# Patient Record
Sex: Female | Born: 1935
Health system: Southern US, Community
[De-identification: ages and names within clinical notes are randomized; demographics above are authoritative.]

## PROBLEM LIST (undated history)

## (undated) DIAGNOSIS — M810 Age-related osteoporosis without current pathological fracture: Secondary | ICD-10-CM

## (undated) DIAGNOSIS — G43909 Migraine, unspecified, not intractable, without status migrainosus: Secondary | ICD-10-CM

## (undated) DIAGNOSIS — E785 Hyperlipidemia, unspecified: Secondary | ICD-10-CM

## (undated) DIAGNOSIS — J449 Chronic obstructive pulmonary disease, unspecified: Secondary | ICD-10-CM

## (undated) DIAGNOSIS — K449 Diaphragmatic hernia without obstruction or gangrene: Secondary | ICD-10-CM

## (undated) DIAGNOSIS — D649 Anemia, unspecified: Secondary | ICD-10-CM

## (undated) DIAGNOSIS — G4700 Insomnia, unspecified: Secondary | ICD-10-CM

## (undated) DIAGNOSIS — F419 Anxiety disorder, unspecified: Secondary | ICD-10-CM

## (undated) DIAGNOSIS — F32A Depression, unspecified: Secondary | ICD-10-CM

## (undated) DIAGNOSIS — K589 Irritable bowel syndrome without diarrhea: Secondary | ICD-10-CM

## (undated) DIAGNOSIS — G20A1 Parkinson's disease without dyskinesia, without mention of fluctuations: Secondary | ICD-10-CM

## (undated) DIAGNOSIS — G20 Parkinson's disease: Secondary | ICD-10-CM

## (undated) DIAGNOSIS — K219 Gastro-esophageal reflux disease without esophagitis: Secondary | ICD-10-CM

## (undated) DIAGNOSIS — R413 Other amnesia: Secondary | ICD-10-CM

## (undated) DIAGNOSIS — E039 Hypothyroidism, unspecified: Secondary | ICD-10-CM

## (undated) DIAGNOSIS — E559 Vitamin D deficiency, unspecified: Secondary | ICD-10-CM

## (undated) DIAGNOSIS — F329 Major depressive disorder, single episode, unspecified: Secondary | ICD-10-CM

## (undated) DIAGNOSIS — D126 Benign neoplasm of colon, unspecified: Secondary | ICD-10-CM

## (undated) HISTORY — DX: Irritable bowel syndrome, unspecified: K58.9

## (undated) HISTORY — PX: ABDOMINAL HYSTERECTOMY: SHX81

## (undated) HISTORY — DX: Benign neoplasm of colon, unspecified: D12.6

## (undated) HISTORY — DX: Major depressive disorder, single episode, unspecified: F32.9

## (undated) HISTORY — DX: Depression, unspecified: F32.A

## (undated) HISTORY — DX: Anemia, unspecified: D64.9

## (undated) HISTORY — DX: Hypothyroidism, unspecified: E03.9

## (undated) HISTORY — DX: Chronic obstructive pulmonary disease, unspecified: J44.9

## (undated) HISTORY — DX: Vitamin D deficiency, unspecified: E55.9

## (undated) HISTORY — DX: Gastro-esophageal reflux disease without esophagitis: K21.9

## (undated) HISTORY — DX: Parkinson's disease without dyskinesia, without mention of fluctuations: G20.A1

## (undated) HISTORY — DX: Other amnesia: R41.3

## (undated) HISTORY — DX: Age-related osteoporosis without current pathological fracture: M81.0

## (undated) HISTORY — DX: Migraine, unspecified, not intractable, without status migrainosus: G43.909

## (undated) HISTORY — DX: Parkinson's disease: G20

## (undated) HISTORY — DX: Diaphragmatic hernia without obstruction or gangrene: K44.9

## (undated) HISTORY — DX: Anxiety disorder, unspecified: F41.9

## (undated) HISTORY — DX: Hyperlipidemia, unspecified: E78.5

## (undated) HISTORY — DX: Insomnia, unspecified: G47.00

---

## 1999-05-28 ENCOUNTER — Other Ambulatory Visit: Admission: RE | Admit: 1999-05-28 | Discharge: 1999-05-28 | Payer: Self-pay | Admitting: Obstetrics and Gynecology

## 1999-05-28 ENCOUNTER — Encounter: Admission: RE | Admit: 1999-05-28 | Discharge: 1999-05-28 | Payer: Self-pay | Admitting: Obstetrics and Gynecology

## 2000-09-17 DIAGNOSIS — D126 Benign neoplasm of colon, unspecified: Secondary | ICD-10-CM

## 2000-09-17 HISTORY — DX: Benign neoplasm of colon, unspecified: D12.6

## 2000-11-18 ENCOUNTER — Encounter: Admission: RE | Admit: 2000-11-18 | Discharge: 2000-11-18 | Payer: Self-pay | Admitting: Obstetrics and Gynecology

## 2000-11-18 ENCOUNTER — Encounter: Payer: Self-pay | Admitting: Obstetrics and Gynecology

## 2000-11-18 ENCOUNTER — Other Ambulatory Visit: Admission: RE | Admit: 2000-11-18 | Discharge: 2000-11-18 | Payer: Self-pay | Admitting: Obstetrics and Gynecology

## 2002-01-17 ENCOUNTER — Ambulatory Visit (HOSPITAL_BASED_OUTPATIENT_CLINIC_OR_DEPARTMENT_OTHER): Admission: RE | Admit: 2002-01-17 | Discharge: 2002-01-18 | Payer: Self-pay | Admitting: Orthopedic Surgery

## 2002-01-17 ENCOUNTER — Encounter: Payer: Self-pay | Admitting: Emergency Medicine

## 2002-04-19 ENCOUNTER — Encounter: Payer: Self-pay | Admitting: Obstetrics and Gynecology

## 2002-04-19 ENCOUNTER — Encounter: Admission: RE | Admit: 2002-04-19 | Discharge: 2002-04-19 | Payer: Self-pay | Admitting: Obstetrics and Gynecology

## 2002-08-17 HISTORY — PX: OTHER SURGICAL HISTORY: SHX169

## 2002-11-20 ENCOUNTER — Ambulatory Visit (HOSPITAL_BASED_OUTPATIENT_CLINIC_OR_DEPARTMENT_OTHER): Admission: RE | Admit: 2002-11-20 | Discharge: 2002-11-20 | Payer: Self-pay | Admitting: Orthopedic Surgery

## 2003-04-25 ENCOUNTER — Encounter: Payer: Self-pay | Admitting: Obstetrics and Gynecology

## 2003-04-25 ENCOUNTER — Encounter: Admission: RE | Admit: 2003-04-25 | Discharge: 2003-04-25 | Payer: Self-pay | Admitting: Obstetrics and Gynecology

## 2005-02-16 ENCOUNTER — Encounter: Admission: RE | Admit: 2005-02-16 | Discharge: 2005-02-16 | Payer: Self-pay | Admitting: Obstetrics and Gynecology

## 2006-06-10 ENCOUNTER — Encounter: Admission: RE | Admit: 2006-06-10 | Discharge: 2006-06-10 | Payer: Self-pay | Admitting: Obstetrics and Gynecology

## 2008-12-13 ENCOUNTER — Encounter: Admission: RE | Admit: 2008-12-13 | Discharge: 2008-12-13 | Payer: Self-pay | Admitting: Internal Medicine

## 2011-01-02 NOTE — H&P (Signed)
[place]  Patient:    Judy, Holt Visit Number: [id number] MRN: [id number]          Service Type: DSU Location: [facility] Attending Physician:  Susa Day Dictated by:   Katy Fitch Naaman Plummer., M.D. Proc. Date: 01/17/02 Admit Date:  01/17/2002 Discharge Date: 01/18/2002                           History and Physical  CHIEF COMPLAINT:  Painful, deformed left wrist.  HISTORY OF PRESENT ILLNESS:  Judy Holt is a 75 year old right-hand dominant woman who earlier this morning caught her foot on the floor covering at work, falling full-weight onto her outstretched left hand.  She also sustained some direct injury to her left chest wall.  She was transported to the [facility] emergency room, where she was evaluated by Theora Gianotti, P.A.-C., and had x-rays of her left wrist and chest.  Her wrist films demonstrated a significantly comminuted, impacted, and dorsally-angulated fracture of the left radius with an ulnar styloid fracture. Her chest x-ray was clear without signs of displaced rib fractures.  An upper extremity orthopedic consult was requested by the family, who is familiar with the [facility].  PAST MEDICAL HISTORY:  Her past medical history was reviewed in detail.  She has a history of Parkinsons syndrome with symptoms primarily presenting on the right side.  She is managed by Genene Churn. Love, M.D., with Sinemet and dopamine medication she could not recall the exact name and dosage of.  Her family physician is Dr. Harrison Mons in [place], [place].  ALLERGIES:  She reports an allergy to CEFTIN.  MEDICATIONS:  Her current medications include Sinemet, Premarin, Synthroid, Claritin, Prozac, and Zocor.  PAST SURGICAL HISTORY:  She has had past sinus surgery in the 1990s and a hysterectomy in 1987.  FAMILY HISTORY:  Her family history was detailed, otherwise noncontributory.  REVIEW OF  SYSTEMS:  No significant cardiac or pulmonary problems.  Her neurologic history was positive for Parkinsons syndrome, managed with medication, with tremor primarily presenting on the right side.  She did not have a significant gait disturbance as a consequence of her Parkinsons syndrome.  She had no hematologic abnormalities.  Endocrine abnormalities were positive for hypothyroidism managed on Synthroid.  Her GI review of systems was negative.  GU negative.  Musculoskeletal negative except for acute fracture.  She does have visual impairment and uses reading glasses and has dentures.  Her reproductive review of systems revealed that she is postmenopausal.  Her skin was unremarkable.  She had no difficulties with sleep and had a normal diet and no sign of malnourishment.  PHYSICAL EXAMINATION:  VITAL SIGNS:  Temperature 97.6, pulse 75 and regular, respirations 16, blood pressure 120/72.  She reports that she is 5 feet and 112 pounds.  GENERAL:  An alert, otherwise well-appearing 75 year old woman.  Her general exam revealed an alert, small 75 year old woman with obvious deformity of her left wrist.  She did not show any signs of facial or head trauma.  She reported on discomfort in her chest, abdomen, or lower extremities.  CHEST:  Clear to auscultation.  CARDIAC:  S1, S2, no murmur or gallop appreciated.  ABDOMEN:  Unremarkable.  NEUROLOGIC:  Grossly intact with intact sensibility in her fingers and lower extremities.  REPRODUCTIVE, BREASTS:  Deferred.  DIAGNOSTIC STUDIES:  X-rays of her chest AP films demonstrated good inspiration, clear lung  fields bilaterally, and normal cardiac shadow, no sign of displaced rib fractures.  AP, lateral, and scaphoid view of her left wrist demonstrated an impacted (approximately 1 cm), dorsally angulated (approximately 45 degrees), comminuted fracture of the distal radius with an apparent intra-articular fracture into the lunate facet with a  significant impaction and displacement.  She was approximately ulnar plus by 3 mm due to the displacement of the fracture.  The ulnar styloid was displaced.  ASSESSMENT:  Status post fall this morning with closed impacted fracture, left distal radius and ulnar styloid avulsion.  Given her degree of displacement, age, and general medical condition, I have recommended proceeding with open reduction and internal fixation with a DVR volar plate system.  The advantages presented to the patient and family included accurate reduction and maintenance of reduction and the ability to begin immediate range of motion exercises and early rehabilitation of the left arm.  In our experience, we have had considerably less difficulties with swelling, joint stiffness, and the development of reflex sympathetic dystrophy with internal fixation of these fractures.  Alternative treatments, including external fixation and cast fixation, were briefly discussed and rejected due to the superior outcomes we have experienced with the DVR system.  Potential risks of infection and plate loosening and/or failure were discussed.  Ms. Vessey reports that she had a bone density study last year and that she was not advised that she had significant osteoporosis or osteopenia.  She does use hormone replacement therapy and takes calcium on a regular basis. Dictated by:   Katy Fitch Naaman Plummer., M.D. Attending Physician:  Susa Day DD:  01/17/02 TD:  01/18/02 Job: [phone] [id number]

## 2011-01-02 NOTE — Op Note (Signed)
[place]  Patient:    Judy Holt, Judy Holt Visit Number: [id number] MRN: [id number]          Service Type: DSU Location: [facility] Attending Physician:  Susa Day Dictated by:   Katy Fitch Naaman Plummer., M.D. Proc. Date: 01/17/02 Admit Date:  01/17/2002 Discharge Date: 01/18/2002                             Operative Report  PREOPERATIVE DIAGNOSIS:  Comminuted impacted fracture of right distal radius sustained in a fall at work this morning.  POSTOPERATIVE DIAGNOSIS:  Comminuted impacted fracture of right distal radius sustained in a fall at work this morning.  OPERATION PERFORMED:  Open reduction internal fixation of left radius with application of a DVR volar fixation plate to obtain and maintain anatomic reduction of radius and allow early active range of motion exercises and early forearm and wrist rehabilitation.  SURGEON:  Katy Fitch. Naaman Plummer., M.D.  ANESTHESIA:  Axillary block.  SUPERVISING ANESTHESIOLOGIST:  Dr. Noreene Larsson.  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  She has right hemiparesis due to Parkinsons syndrome.  While ambulating at work today, she actually caught one of her feet on the floor surface and fell full weightbearing onto her left side.  She had a contusion type injury to her chest wall and an injury to her left wrist.  She was transported to [facility] [facility] [employer] [facility] Emergency Department where an upper extremity orthopedic consult was requested by her family.  One of her daughters is an Human resources officer of the [employer] house manager.  Clinical examination revealed a very pleasant 75 year old woman who was awake and alert and had no sign of significant head chest, abdominal or pelvic injury.  She did have an obvious deformity of the left wrist.  She has been evaluated by the emergency room staff and had had a  chest x-ray demonstrating clear lung fields and no sign of displaced rib fractures.  AP lateral and scaphoid views of the left wrist demonstrated a comminuted, impacted distal metaphyseal fracture of the radius with more than 45 degrees of dorsal angulation of the end plate, marked comminution of the dorsal cortex and loss of about 1 cm of length.  She was clearly ulnar plus.  She also had an avulsion of the ulnar styloid.  She had had a bone density checked last year and was advised she was not osteoporotic.  She has been taking both calcium and estrogen supplements.  I advised her to consider open reduction internal fixation to obtain and maintain anatomic reduction of her radius to facilitate early rehabilitation. After conferring with her husband and daughters, she decided to proceed with surgery as recommended.  Alternative treatments in the form of closed reduction and cast, immobilization and/or external fixation were briefly discussed and in my judgment were not optimum choices.  She understands that there is a small risk of infection and/or plate complications with open reduction internal fixation.  DESCRIPTION OF PROCEDURE:  [name] was brought to the operating room and placed in supine position on the operating table.  Following placement of an axillary block in the holding area by Dr. Noreene Larsson, anesthesia was satisfactory in the left arm.  The arm was prepped with Betadine soap and solution and sterilely draped.  500 mg of vancomycin was administered as an IV prophylactic  antibiotic.  The left arm was exsanguinated with an Esmarch bandage and an arterial tourniquet on the proximal brachium was inflated to 230 mHg.  The procedure commenced with an extended flexor carpi radialis incision as outlined for DVR plate placement.  The subcutaneous tissues were carefully divided  taking care to identify the superficial branch of the radial artery an the flexor carpi radialis.   The flexor carpi radialis was retracted in an ulnar direction and the floor of the flexor carpi radialis tendon sheath incised.  The flexor pollicis longus, longus, median nerve and superficialis muscles were retracted in an ulnar direction with blunt retractors and the radial artery retracted in a radial direction.  The pronator quadratus was isolated and elevated with a Therapist, nutritional.  The fracture site was significantly displaced and impacted.  The hematoma was cleared and the fracture reduced anatomically. The wrist was flexed with a towel behind the carpus.  A small DVR left plate was placed with provisional fixation in the sliding hole.  This was advanced to be properly positioned to support the metaphysis.  The threaded pegs were then placed sequentially utilizing fully threaded pegs times two and partially threaded pegs times two.  The C-arm fluoroscope was used to document anatomic reduction and very satisfactory position of the pegs into the distal epiphysis without violating the joint surface of the radius.  A 2 mm radial plus configuration was achieved.  The dorsal cortex was reduced anatomically, however, the comminution was easily visualized with the fluoroscope.  The dorsal periosteum should reduce the fracture fragments and allow rapid healing of the dorsal cortex.  The plate was fixed with the standard volar threaded ASIF screws.  AP lateral and oblique C-arm images were obtained documenting good  position of all hardware and anatomic reduction of the radius.  The ulnar styloid fracture fragment was displaced but appeared to be in satisfactory position. Full pronation and supination of the forearm was noted with stability of the distal radioulnar joint being achieved.  The wound was then irrigated with sterile saline followed by repair of the pronator quadratus over the plate with mattress sutures of 3-0 Vicryl followed by repair of the skin with subdermal sutures of  4-0 Vicryl and intradermal 3-0 Prolene and Steri-Strips. Tourniquet was released with immediate capillary refill of the thumb and  fingers followed by application of voluminous sterile gauze and sterile Webril dressing and a sugar-tong splint with the forearm in full supination. For aftercare, Ms. Neer will be admitted to [facility] for observation of her vital signs and appropriate analgesics in the form of IV PCA morphine, IV Dilaudid and p.o. Percocet.  She is noted to be ALLERGIC TO CEPHALOSPORINS, therefore she is given IV vancomycin as a prophylactic antibiotic and she will be discharged with Levaquin 500 mg 1 p.o. q.d. times five days as a prophylactic antibiotic.  She is given a prescription for Percocet 5 mg one or two tablets p.o. q.4-6h. p.r.n. pain, 20 tablets without refill.  We anticipate seeing her back in the office in follow-up in seven to 10 days. Dictated by:   Katy Fitch Naaman Plummer., M.D. Attending Physician:  Susa Day DD:  01/17/02 TD:  01/18/02 Job: [phone] [id number]

## 2011-01-02 NOTE — Op Note (Signed)
   Judy Holt, Judy Holt                        ACCOUNT NO.:  [id number]   MEDICAL RECORD NO.:  [id number]                   PATIENT TYPE:  AMB   LOCATION:  DSC                                  FACILITY:  [facility]   PHYSICIAN:  Katy Fitch. Naaman Plummer., M.D.          DATE OF BIRTH:  [birth year]   DATE OF PROCEDURE:  11/21/2002  DATE OF DISCHARGE:                                 OPERATIVE REPORT   PREOPERATIVE DIAGNOSIS:  Chronic entrapment neuropathy, median nerve, left  carpal tunnel.   POSTOPERATIVE DIAGNOSIS:  Chronic entrapment neuropathy, median nerve, left  carpal tunnel.   OPERATION:  Release of left transverse carpal ligament.   SURGEON:  Katy Fitch. Sypher, M.D.   ASSISTANT:  Marveen Reeks. Dasnoit, P.A.-C.   ANESTHESIA:  General by LMA.   ANESTHESIOLOGIST:  Quita Skye. Krista Blue, M.D.   INDICATIONS FOR PROCEDURE:  The patient is a 75 year old woman who has  experienced bilateral hand numbness.  Clinical examination suggests a carpal  tunnel syndrome, and the electrodiagnostic studies confirm carpal tunnel  syndrome.  Due to a failure to respond to non-operative measures, she is brought to the  operating room at this time for a release of her left transverse carpal  ligament.   DESCRIPTION OF PROCEDURE:  The patient is brought to the operating room and  placed in the supine position upon the operating room table.  Following  induction of general anesthesia by LMA, the left arm was prepped with  Betadine soap and solution and sterilely draped.  Following exsanguination  of the left arm with an Esmarch bandage and arterial tourniquet on the  proximal brachium, was inflated to 220 mmHg.  The procedure commenced with a  short incision in the line of the ring finger and the palm.  The  subcutaneous tissues were carefully divided, revealing the palmar fascia.  This was split longitudinally to reveal the common sensory branches of the  median nerve.  These were then followed back to the  transverse carpal  ligament which was carefully isolated from the median nerve.  Bleeding  points along the margin of the released ligament were electrocauterized with  bipolar current, followed by a repair of the skin with intradermal #3-0  Prolene suture.  A compressive dressing is applied with a volar plaster  splint maintaining the wrist in 5 degrees of dorsiflexion.  There were no  apparent complications.                                                 Katy Fitch Naaman Plummer., M.D.    RVS/MEDQ  D:  11/21/2002  T:  11/22/2002  Job:  [id number]

## 2012-10-08 ENCOUNTER — Encounter: Payer: Self-pay | Admitting: Neurology

## 2012-10-08 DIAGNOSIS — G20A1 Parkinson's disease without dyskinesia, without mention of fluctuations: Secondary | ICD-10-CM | POA: Insufficient documentation

## 2012-10-08 DIAGNOSIS — M810 Age-related osteoporosis without current pathological fracture: Secondary | ICD-10-CM | POA: Insufficient documentation

## 2012-10-08 DIAGNOSIS — G20 Parkinson's disease: Secondary | ICD-10-CM

## 2012-10-08 DIAGNOSIS — E039 Hypothyroidism, unspecified: Secondary | ICD-10-CM | POA: Insufficient documentation

## 2012-10-08 DIAGNOSIS — G43909 Migraine, unspecified, not intractable, without status migrainosus: Secondary | ICD-10-CM | POA: Insufficient documentation

## 2012-10-08 DIAGNOSIS — F329 Major depressive disorder, single episode, unspecified: Secondary | ICD-10-CM | POA: Insufficient documentation

## 2012-11-07 ENCOUNTER — Encounter: Payer: Self-pay | Admitting: Neurology

## 2012-11-07 ENCOUNTER — Ambulatory Visit (INDEPENDENT_AMBULATORY_CARE_PROVIDER_SITE_OTHER): Payer: Medicare Other | Admitting: Neurology

## 2012-11-07 VITALS — BP 136/72 | HR 64 | Ht <= 58 in | Wt 109.0 lb

## 2012-11-07 DIAGNOSIS — F329 Major depressive disorder, single episode, unspecified: Secondary | ICD-10-CM

## 2012-11-07 DIAGNOSIS — F411 Generalized anxiety disorder: Secondary | ICD-10-CM

## 2012-11-07 DIAGNOSIS — G43909 Migraine, unspecified, not intractable, without status migrainosus: Secondary | ICD-10-CM | POA: Insufficient documentation

## 2012-11-07 DIAGNOSIS — G20 Parkinson's disease: Secondary | ICD-10-CM | POA: Insufficient documentation

## 2012-11-07 DIAGNOSIS — E559 Vitamin D deficiency, unspecified: Secondary | ICD-10-CM

## 2012-11-07 DIAGNOSIS — K219 Gastro-esophageal reflux disease without esophagitis: Secondary | ICD-10-CM | POA: Insufficient documentation

## 2012-11-07 DIAGNOSIS — E039 Hypothyroidism, unspecified: Secondary | ICD-10-CM | POA: Insufficient documentation

## 2012-11-07 DIAGNOSIS — F419 Anxiety disorder, unspecified: Secondary | ICD-10-CM

## 2012-11-07 HISTORY — DX: Vitamin D deficiency, unspecified: E55.9

## 2012-11-07 MED ORDER — CARBIDOPA-LEVODOPA 25-100 MG PO TABS: 1.0000 | ORAL_TABLET | Freq: Three times a day (TID) | ORAL | Status: DC

## 2012-11-07 MED ORDER — PRAMIPEXOLE DIHYDROCHLORIDE 1.5 MG PO TABS: ORAL_TABLET | ORAL | Status: DC

## 2012-11-07 NOTE — Progress Notes (Signed)
Subjective:    Patient ID: Judy Holt is a 77 y.o. female.  HPI Interim history:  Judy Holt is a very pleasant 77 year old right-handed woman who presents for followup consultation of her Parkinson's disease. This is her first visit with me and she is accompanied by her husband today. She has been followed by Dr. Sandria Manly for the past several years since her Dx (almost 2 y now) and was last seen by him on 06/15/2012 at which time he felt she was doing well. He had noticed mild dyskinesias of her right foot. He encouraged her to exercise. She is maintained on a combination of carbidopa-levodopa 25/100 mg strength one pill at 9 AM, 1 tablet 11 AM, and half a pill at 4 PM. In addition, she is on pramipexole 1.5 mg tablets half a tablet 4 times a day, namely at 9, 11, 4 PM and 8 PM. She also has an underlying history of hypothyroidism, anxiety, reflux disease, depression, vitamin D deficiency and hyperlipidemia. She has not had any falls. She fell in the last year of her work and broke her L wrist. This was in the 90s. She takes oxybutynin as needed for bladder urgency.   I reviewed Dr. Imagene Gurney prior notes and records and below is a summary of that review:  77 year old RH female with IPD for 10 years, beginning with right hand and arm tremor. She has had side effects from medications including nausea. Because of her nausea with Sinemet, Mirapex was added. She has not had her medicine changed in several years. Her major manifestation of Parkinson's disease is R hand and foot tremor. She denies daytime sleepiness, compulsive behavior, postural dizziness or swelling in the legs. She has depression and memory loss, and denies hallucinations. She takes a nap for 30 minutes after lunch 3 times per week. She is not having  bowel or bladder incontinence. She is independent in her ADLs and drives a car. She is not in an exercise program. She does not use a cane or a walker. Her falls assessment tool score is 9. She  says her memory is not as good as it has been. She was on sertraline for her depression and also tried cymbalta in the past. She is now on Lexapro 10 mg for her depression and for her anxiety she is on Xanax 0.5 mg qHS. She can take up to 2 a day. Her PCP is Gaye Alken, NP at [facility].    Her  Past Medical History  Diagnosis Date  . Depression   . Anxiety   . Migraines   . Parkinson's disease     Her History reviewed. No pertinent past surgical history.  Her family history is significant for cancer. Mom is 51 and in a NH. She has AD. No siblings. 2 Grown daughters, both healthy, one grand daughter.   Her  History   Social History  . Marital Status: Married    Spouse Name: N/A    Number of Children: N/A  . Years of Education: N/A   Social History Main Topics  . Smoking status: Current Every Day Smoker    Types: Cigarettes, Pipe, Cigars  . Smokeless tobacco: None  . Alcohol Use: None  . Drug Use: None  . Sexually Active: None   Other Topics Concern  . None   Social History Narrative  . None   Her  Allergies  Allergen Reactions  . Ciprofloxacin   . Cymbalta (Duloxetine Hcl)   . Nitrofurantoin Macrocrystal   .  Sulfa Antibiotics   . Mobic (Meloxicam) Itching and Swelling   Her  Outpatient Encounter Prescriptions as of 11/07/2012  Medication Sig Dispense Refill  . ALPRAZolam (XANAX) 0.5 MG tablet Take 0.5 mg by mouth 3 (three) times daily as needed for sleep.      . butalbital-acetaminophen-caffeine (FIORICET, ESGIC) 50-325-40 MG per tablet Take 1 tablet by mouth 2 (two) times daily as needed for headache.      . carbidopa-levodopa (SINEMET IR) 25-100 MG per tablet Take 1 tablet by mouth 3 (three) times daily.      Marland Kitchen denosumab (PROLIA) 60 MG/ML SOLN injection Inject 60 mg into the skin every 6 (six) months. Administer in upper arm, thigh, or abdomen      . escitalopram (LEXAPRO) 10 MG tablet Take 10 mg by mouth daily.      . lansoprazole (PREVACID) 15 MG capsule Take 15  mg by mouth daily.      Marland Kitchen levothyroxine (SYNTHROID, LEVOTHROID) 75 MCG tablet Take 75 mcg by mouth daily.      Marland Kitchen lovastatin (MEVACOR) 20 MG tablet Take 20 mg by mouth at bedtime.      Marland Kitchen oxybutynin (DITROPAN) 5 MG tablet Take 5 mg by mouth as directed.      . pramipexole (MIRAPEX) 1.5 MG tablet Take 1.5 mg by mouth 3 (three) times daily.      . Vitamin D, Ergocalciferol, (DRISDOL) 50000 UNITS CAPS Take 50,000 Units by mouth.       No facility-administered encounter medications on file as of 11/07/2012.    Review of Systems  Skin: Positive for rash.  Psychiatric/Behavioral: Positive for confusion.    Objective:  Neurologic Exam  Physical Exam  Physical Examination:   Filed Vitals:   11/07/12 1053  BP: 136/72  Pulse: 64    General Examination: The patient is a very pleasant 77 y.o. female in no acute distress.  HEENT: Normocephalic, atraumatic, pupils are equal, round and reactive to light and accommodation. Extraocular tracking shows mild saccadic breakdown. There is no nystagmus. Hearing is grossly intact. Tympanic membranes are clear bilaterally. Face is symmetric with minimal facial masking noted. Speech is clear with no dysarthria noted. Speech is mildly hypophonic. There is no lip, neck or jaw tremor. Neck is very mildly rigid with full range of motion. Oropharynx exam reveals normal findings except for very mild mouth dryness. No significant airway crowding is noted. Mallampati is class II. Tongue protrudes centrally and palate elevates symmetrically.  Chest: is clear to auscultation without wheezing, rhonchi or crackles noted.  Heart: sounds are normal without murmurs, rubs or gallops noted.  Abdomen: is soft, non-tender and non-distended with normal bowel sounds appreciated on auscultation.  Extremities: There is no pitting edema in the distal lower extremities bilaterally. Pedal pulses are intact.  Skin: is warm and dry with no trophic changes noted. Mildly patchy dry  skin is noted.  Musculoskeletal: exam reveals no obvious joint deformities, tenderness or joint swelling or erythema.  Neurologically:  Mental status: The patient is awake, alert and oriented in all 4 spheres. Her memory, attention, language and knowledge are appropriate. There is no aphasia, agnosia, apraxia or anomia. Speech is clear with normal prosody and enunciation, but mild hypophonia noted. Thought process is linear. Mood is congruent. Affect is normal.  Cranial nerves are as described above under HEENT exam. In addition, shoulder shrug is normal with equal shoulder height noted. Motor exam: Normal bulk, and strength is noted. Tone is very mildly increased throughout. There is  no drift, tremor or rebound. Romberg is negative. Reflexes are 2+ throughout. Fine motor skills are mildly impaired, right more so than left. This includes finger taps, hand movements, rapid alternating patting, foot taps and foot agility.  Cerebellar testing shows no dysmetria or intention tremor on finger to nose testing. There is no truncal or gait ataxia.  Sensory exam is intact to light touch, pinprick, vibration, temperature sense.  She stands up without problems and does not need any assistance. Posture is very mildly stooped for age. She walks with good pace but decreased stride length and decreased arm swing on the right. She turns well. Balance is preserved.                 Assessment and Plan:   Assessment and Plan:  In summary, Judy Holt is a very pleasant 77 y.o.-year old female with a long-standing history of idiopathic Parkinson's disease, right-sided predominant with stable findings. I had a long chat with the patient and her husband about my findings and the diagnosis, its prognosis and treatment options. We talked about medical treatments and non-pharmacological approaches. We talked about maintaining a healthy lifestyle in general. I encouraged the patient to eat healthy, exercise daily and  keep well hydrated, to keep a scheduled bedtime and wake time routine, to not skip any meals and eat healthy snacks in between meals.  I recommended the following at this time: Continue current medication regimen and try to exercise daily. I answered all her questions today and the patient and her husband were in agreement. I would like to see her back in 6 months, sooner if the need arises and encouraged them to call with any interim questions, concerns, problems or updates and refill requests. I did review her Sinemet and pramipexole prescriptions for 90 days today.

## 2012-11-07 NOTE — Patient Instructions (Addendum)
I think overall you are doing fairly well.  Please make sure that you drink plenty of fluids. I would like for you to exercise daily for example in the form of walking 20-30 minutes every day. Please keep regular sleep-wake schedule. And keep regular mealtimes, do not skip any meals, eat  healthy snacks in between meals. Try to eat protein with every meal. Let's not make any changes in your medications at this point. I think you're stable enough that I can see you back in 6 months. Please call us if you have any interim questions, concerns, or problems or updates to discuss. Please also call us for any test results so we can go over those with you on the phone. Brett Canales is my clinical assistant and will answer any of your questions and relay your messages to me.  Our phone number is [phone].

## 2013-05-10 ENCOUNTER — Encounter: Payer: Self-pay | Admitting: Neurology

## 2013-05-10 ENCOUNTER — Ambulatory Visit (INDEPENDENT_AMBULATORY_CARE_PROVIDER_SITE_OTHER): Payer: Medicare Other | Admitting: Neurology

## 2013-05-10 VITALS — BP 128/62 | HR 57 | Temp 97.9°F | Ht <= 58 in | Wt 107.0 lb

## 2013-05-10 DIAGNOSIS — F329 Major depressive disorder, single episode, unspecified: Secondary | ICD-10-CM

## 2013-05-10 DIAGNOSIS — F411 Generalized anxiety disorder: Secondary | ICD-10-CM

## 2013-05-10 DIAGNOSIS — F419 Anxiety disorder, unspecified: Secondary | ICD-10-CM

## 2013-05-10 DIAGNOSIS — E559 Vitamin D deficiency, unspecified: Secondary | ICD-10-CM

## 2013-05-10 DIAGNOSIS — G43909 Migraine, unspecified, not intractable, without status migrainosus: Secondary | ICD-10-CM

## 2013-05-10 DIAGNOSIS — G20 Parkinson's disease: Secondary | ICD-10-CM

## 2013-05-10 NOTE — Patient Instructions (Signed)
I think overall you are doing fairly well and are stable at this point.   I do have some generic suggestions for you today:  Please make sure that you drink plenty of fluids. I would like for you to exercise daily for example in the form of walking 20-30 minutes every day, if you can. Please keep a regular sleep-wake schedule, keep regular meal times, do not skip any meals, eat  healthy snacks in between meals, such as fruit or nuts. Try to eat protein with every meal.   As far as your medications are concerned, I would like to suggest: no changes.   As far as diagnostic testing, I recommend: no new tests.  Engage in social activities in your community and with your family and try to keep up with current events by reading the newspaper or watching the news.  I do not think we need to make any changes in your medications at this point. I think you're stable enough that I can see you back in 6 months, sooner if we need to. Please call us if you have any interim questions, concerns, or problems or updates to need to discuss.  Brett Canales is my clinical assistant and will answer any of your questions and relay your messages to me and will give you my messages.   Our phone number is [phone]. We also have an after hours call service for urgent matters and there is a physician on-call for urgent questions. For any emergencies you know to call 911 or go to the nearest emergency room.

## 2013-05-10 NOTE — Progress Notes (Signed)
Subjective:    Patient ID: Judy Holt is a 77 y.o. female.  HPI  Interim history:  Judy Holt is a very pleasant 77 year old right-handed woman who presents for followup consultation of her Parkinson's disease. I first met her on 11/07/2012 at which time I continued her on the same medications and encouraged her to exercise regularly. She is accompanied by her husband again today. She previously followed with Dr. Sandria Manly for the past several years since her Dx (about 2 y now) and was last seen by him on 06/15/2012 at which time he felt she was doing well. He had noticed mild dyskinesias of her right foot. He encouraged her to exercise. She is maintained on a combination of carbidopa-levodopa 25/100 mg strength one pill at 9 AM, 1 tablet 11 AM, and half a pill at 4 PM and pramipexole 1.5 mg tablets half a tablet 4 times a day, namely at 9, 11, 4 PM and 8 PM. She also has an underlying history of hypothyroidism, anxiety, reflux disease, depression, vitamin D deficiency and hyperlipidemia. She has stopped the Lovastatin some months ago, and states, that her lipids were perfect. She has not had any falls, but has occasional balance issues. She fell when she was still working and broke her L wrist and required emergent surgery. This was in the 90s. She takes oxybutynin as needed for bladder urgency. She fell 2 years ago in the kitchen, but did not hit her head.  Her symptoms began on the right side with hand and arm tremor. She has had side effects from medications including nausea. Because of her nausea with Sinemet, Mirapex was added. She has not had her medicine changed in several years. Her major manifestation of Parkinson's disease is R hand and foot tremor. She denies daytime sleepiness, compulsive behavior, postural dizziness or swelling in the legs. She has depression and memory loss, and denies hallucinations. She takes a nap for 30 minutes after lunch 3 times per week. She is not having bowel or  bladder incontinence. She is independent in her ADLs and drives a car. She is not in an exercise program. She does not use a cane or a walker. Her falls assessment tool score was 9. She says her memory is not as good as it has been. She was on sertraline for her depression and also tried cymbalta in the past. She is on Lexapro 10 mg for her depression and for her anxiety she is on Xanax 0.5 mg qHS. She can take up to 2 a day. Her PCP is Gaye Alken, NP at [facility].  She reports no new issues and feels stable.   Her Past Medical History Is Significant For: Past Medical History  Diagnosis Date  . Depression   . Anxiety   . Migraines   . Parkinson's disease   . Hypothyroidism   . Acid reflux disease   . Unspecified vitamin D deficiency 11/07/2012    Her Past Surgical History Is Significant For: No past surgical history on file.  Her Family History Is Significant For: No family history on file.  Her Social History Is Significant For: History   Social History  . Marital Status: Married    Spouse Name: N/A    Number of Children: N/A  . Years of Education: N/A   Social History Main Topics  . Smoking status: Current Every Day Smoker    Types: Cigarettes, Pipe, Cigars  . Smokeless tobacco: None  . Alcohol Use: None  . Drug Use:  None  . Sexual Activity: None   Other Topics Concern  . None   Social History Narrative  . None    Her Allergies Are:  Allergies  Allergen Reactions  . Ciprofloxacin   . Cymbalta [Duloxetine Hcl]   . Nitrofurantoin Macrocrystal   . Sulfa Antibiotics   . Mobic [Meloxicam] Itching and Swelling  :   Her Current Medications Are:  Outpatient Encounter Prescriptions as of 05/10/2013  Medication Sig Dispense Refill  . ALPRAZolam (XANAX) 0.5 MG tablet Take 0.5 mg by mouth 3 (three) times daily as needed for sleep.      . butalbital-acetaminophen-caffeine (FIORICET, ESGIC) 50-325-40 MG per tablet Take 1 tablet by mouth 2 (two) times daily as needed for  headache.      . carbidopa-levodopa (SINEMET IR) 25-100 MG per tablet Take 1 tablet by mouth 3 (three) times daily.  270 tablet  3  . denosumab (PROLIA) 60 MG/ML SOLN injection Inject 60 mg into the skin every 6 (six) months. Administer in upper arm, thigh, or abdomen      . escitalopram (LEXAPRO) 10 MG tablet Take 10 mg by mouth daily.      . lansoprazole (PREVACID) 15 MG capsule Take 15 mg by mouth daily.      Marland Kitchen levothyroxine (SYNTHROID, LEVOTHROID) 75 MCG tablet Take 75 mcg by mouth daily.      Marland Kitchen oxybutynin (DITROPAN) 5 MG tablet Take 5 mg by mouth as directed.      . pramipexole (MIRAPEX) 1.5 MG tablet Take 1/2 pill 4 times a day: at 9am, 11am, 4pm and 8pm  180 tablet  3  . Vitamin D, Ergocalciferol, (DRISDOL) 50000 UNITS CAPS Take 50,000 Units by mouth.      . lovastatin (MEVACOR) 20 MG tablet Take 20 mg by mouth at bedtime.       No facility-administered encounter medications on file as of 05/10/2013.  :  Review of Systems  Psychiatric/Behavioral: Positive for dysphoric mood. The patient is nervous/anxious.     Objective:  Neurologic Exam  Physical Exam Physical Examination:   Filed Vitals:   05/10/13 1147  BP: 128/62  Pulse: 57  Temp: 97.9 F (36.6 C)    General Examination: The patient is a very pleasant 77 y.o. female in no acute distress.  HEENT: Normocephalic, atraumatic, pupils are equal, round and reactive to light and accommodation. Extraocular tracking shows mild saccadic breakdown and mild limitation to upgaze. There is no nystagmus. Hearing is grossly intact. Face is symmetric with minimal facial masking noted. Speech is clear with no dysarthria noted. Speech is mildly hypophonic. There is no lip, neck or jaw tremor. Neck is very mildly rigid with full range of motion. Oropharynx exam reveals normal findings except for very mild mouth dryness. No significant airway crowding is noted. Mallampati is class II. Tongue protrudes centrally and palate elevates  symmetrically.  Chest: is clear to auscultation without wheezing, rhonchi or crackles noted.  Heart: sounds are normal without murmurs, rubs or gallops noted.  Abdomen: is soft, non-tender and non-distended with normal bowel sounds appreciated on auscultation.  Extremities: There is no pitting edema in the distal lower extremities bilaterally. Pedal pulses are intact.  Skin: is warm and dry with no trophic changes noted. Mildly patchy dry skin is noted.  Musculoskeletal: exam reveals no obvious joint deformities, tenderness or joint swelling or erythema.  Neurologically:  Mental status: The patient is awake, alert and oriented in all 4 spheres. Her memory, attention, language and knowledge are appropriate.  There is no aphasia, agnosia, apraxia or anomia. Speech is clear with normal prosody and enunciation, but mild hypophonia noted. Thought process is linear. Mood is congruent. Affect is normal.  Cranial nerves are as described above under HEENT exam. In addition, shoulder shrug is normal with equal shoulder height noted. Motor exam: Normal bulk, and strength is noted. Tone is very mildly increased throughout. There is no drift, tremor or rebound. Romberg is negative. Reflexes are 2+ to 3+ throughout. Fine motor skills are mildly impaired, right more so than left. This includes finger taps, hand movements, rapid alternating patting, foot taps and foot agility.  Cerebellar testing shows no dysmetria or intention tremor on finger to nose testing. There is no truncal or gait ataxia.  Sensory exam is intact to light touch, pinprick, vibration, temperature sense.  She stands up without problems and does not need any assistance. Posture is very mildly stooped for age. She walks with good pace but decreased stride length and slightly decreased arm swing on the right. She turns well. Balance is preserved.                 Assessment and Plan:    In summary, Judy Holt is a very pleasant 70  -year old female with a long-standing history of idiopathic Parkinson's disease, right-sided predominant with stable findings. She has not progressed in the last 6 months. She is tolerating her medications. I had a long chat with the patient and her husband about my findings and the diagnosis, its prognosis and treatment options. We talked about medical treatments and non-pharmacological approaches. We talked about maintaining a healthy lifestyle in general. I encouraged the patient to eat healthy, exercise daily and keep well hydrated, to keep a scheduled bedtime and wake time routine, to not skip any meals and eat healthy snacks in between meals.  I recommended the following at this time: Continue current medication regimen and try to exercise daily. I answered all her questions today and the patient and her husband were in agreement. I would like to see her back in 6 months, sooner if the need arises and encouraged them to call with any interim questions, concerns, problems or updates and refill requests. I reviewed her Sinemet and pramipexole prescriptions last time.

## 2013-09-17 HISTORY — PX: OTHER SURGICAL HISTORY: SHX169

## 2013-11-13 ENCOUNTER — Ambulatory Visit (INDEPENDENT_AMBULATORY_CARE_PROVIDER_SITE_OTHER): Payer: Medicare Other | Admitting: Neurology

## 2013-11-13 ENCOUNTER — Encounter: Payer: Self-pay | Admitting: Neurology

## 2013-11-13 ENCOUNTER — Encounter (INDEPENDENT_AMBULATORY_CARE_PROVIDER_SITE_OTHER): Payer: Self-pay

## 2013-11-13 VITALS — BP 120/68 | HR 59 | Temp 96.1°F | Ht <= 58 in | Wt 102.0 lb

## 2013-11-13 DIAGNOSIS — E559 Vitamin D deficiency, unspecified: Secondary | ICD-10-CM

## 2013-11-13 DIAGNOSIS — F3289 Other specified depressive episodes: Secondary | ICD-10-CM

## 2013-11-13 DIAGNOSIS — G20 Parkinson's disease: Secondary | ICD-10-CM

## 2013-11-13 DIAGNOSIS — F411 Generalized anxiety disorder: Secondary | ICD-10-CM

## 2013-11-13 DIAGNOSIS — G43909 Migraine, unspecified, not intractable, without status migrainosus: Secondary | ICD-10-CM

## 2013-11-13 DIAGNOSIS — F32A Depression, unspecified: Secondary | ICD-10-CM

## 2013-11-13 DIAGNOSIS — F329 Major depressive disorder, single episode, unspecified: Secondary | ICD-10-CM

## 2013-11-13 DIAGNOSIS — F419 Anxiety disorder, unspecified: Secondary | ICD-10-CM

## 2013-11-13 DIAGNOSIS — E039 Hypothyroidism, unspecified: Secondary | ICD-10-CM

## 2013-11-13 DIAGNOSIS — M810 Age-related osteoporosis without current pathological fracture: Secondary | ICD-10-CM

## 2013-11-13 MED ORDER — CARBIDOPA-LEVODOPA 25-100 MG PO TABS: 1.0000 | ORAL_TABLET | Freq: Three times a day (TID) | ORAL | Status: DC

## 2013-11-13 MED ORDER — PRAMIPEXOLE DIHYDROCHLORIDE 1.5 MG PO TABS: ORAL_TABLET | ORAL | Status: DC

## 2013-11-13 NOTE — Patient Instructions (Addendum)
I think your Parkinson's disease has remained fairly stable, which is reassuring. Nevertheless, as you know, this disease does progress with time. It can affect your balance, your memory, your mood, your bowel and bladder function, your posture, balance and walking. Overall you are doing fairly well but I do want to suggest a few things today:  Remember to drink plenty of fluid, eat healthy meals and do not skip any meals. Try to eat protein with a every meal and eat a healthy snack such as fruit or nuts in between meals. Try to keep a regular sleep-wake schedule and try to exercise daily, particularly in the form of walking, 20-30 minutes a day, if you can.   Taking your medication on schedule is key.   Try to stay active physically and mentally. Engage in social activities in your community and with your family and try to keep up with current events by reading the newspaper or watching the news. Try to do word puzzles and you may like to do word puzzles and brain games on the computer such as on [email or web address]   As far as your medications are concerned, I would like to suggest that you take your current medication with the following additional changes: Please try to take the carbidopa-levodopa away from you mealtimes, that is, ideally either one hour before or 2 hours after your meal to ensure optimal absorption. The medication can interfere with the protein content of your meal and trying to the protein in your food and therefore not get fully absorbed.  Common side effects reported are: Nausea, vomiting, sedation, confusion, lightheadedness. Rare side effects include hallucinations, severe nausea or vomiting, diarrhea and significant drop in blood pressure especially when going from lying to standing or from sitting to standing. You can continue taking the pramipexole 1.5 mg 1/2 4 times a day.     As far as diagnostic testing, I will order: no new test today.  I would like to see you back in 6  months, sooner if we need to. Please call us with any interim questions, concerns, problems, updates or refill requests.  Please also call us for any test results so we can go over those with you on the phone. Our nursing staff will answer any of your questions and relay your messages to me and also relay most of my messages to you.  Our phone number is [phone]. We also have an after hours call service for urgent matters and there is a physician on-call for urgent questions, that cannot wait till the next work day. For any emergencies you know to call 911 or go to the nearest emergency room.

## 2013-11-13 NOTE — Progress Notes (Signed)
Subjective:    Patient ID: Judy Holt is a 78 y.o. female.  HPI    Interim history:   Judy Holt is a very pleasant 78 year old right-handed woman with an underlying medical history of depression, anxiety, migraine headaches, hypothyroidism, reflux disease, hyperlipidemia, and vitamin D deficiency, status post left wrist surgery after a fall resulting in left wrist fracture, s/p cataract repairs in 4/09, who presents for followup consultation of her right-sided predominant Parkinson's disease, complicated by mild intermittent dyskinesias. She is accompanied by her husband again today. I last saw her on 05/10/2013, at which time and felt she was for the most part stable and I did not suggest to change her medications. I did encourage her to exercise regularly.  Today, she states, that she had a bladder suspension surgery at [facility] on or around 09/26/13. She stayed overnight for one night. She has not had any falls recently. She has her laundry room in her basement. She has been sleeping fairly well. She takes Xanax 0.5 mg each night, occasionally in the day for anxiety and stress. Her migraines are not as bad. She feels overall quite stable. Her husband endorses. Her C/L is with meals.   I first met her on 11/07/2012 at which time I continued her on the same medications and encouraged her to exercise regularly.  She previously followed with Dr. Erling [name] for the past several years since her Dx about 10 years ago, and was last seen by him on 06/15/2012 at which time he felt she was doing well. He had noticed mild dyskinesias of her right foot. He encouraged her to exercise. She is maintained on a combination of carbidopa-levodopa 25/100 mg strength one pill at 9 AM, 1 tablet 11 AM, and half a pill at 4 PM and pramipexole 1.5 mg tablets half a tablet 4 times a day, namely at 9, 11, 4 PM and 8 PM. She stopped Lovastatin, and stated that her lipids were perfect. She has not had any falls, but  has occasional balance issues. She fell when she was still working and broke her L wrist and required emergent surgery. This was in the 90s. She takes oxybutynin as needed for bladder urgency. She fell 2 years ago in the kitchen, but did not hit her head.  Her symptoms began on the right side with hand and arm tremor. She has had side effects from medications including nausea. Because of her nausea with Sinemet, Mirapex was added. She has not had her medicine changed in several years. Her major manifestation of Parkinson's disease is R hand and foot tremor. She denies daytime sleepiness, compulsive behavior, postural dizziness or swelling in the legs. She has depression and memory loss, and denies hallucinations. She takes a nap for 30 minutes after lunch 3 times per week. She is independent in her ADLs and drives a car. She is not in an exercise program. She does not use a cane or a walker. Her falls assessment tool score was 9. She says her memory is not as good as it has been. She was on sertraline for her depression and also tried cymbalta in the past. She is on Lexapro 10 mg for her depression and for her anxiety she is on Xanax 0.5 mg qHS. She can take up to 2 a day. Her PCP is Laverna Peace, NP at [place].   Her Past Medical History Is Significant For: Past Medical History  Diagnosis Date  . Depression   . Anxiety   .  Migraines   . Parkinson's disease   . Hypothyroidism   . Acid reflux disease   . Unspecified vitamin D deficiency 11/07/2012    Her Past Surgical History Is Significant For: History reviewed. No pertinent past surgical history.  Her Family History Is Significant For: No family history on file.  Her Social History Is Significant For: History   Social History  . Marital Status: Married    Spouse Name: N/A    Number of Children: N/A  . Years of Education: N/A   Social History Main Topics  . Smoking status: Current Every Day Smoker    Types: Cigarettes, Pipe, Cigars  .  Smokeless tobacco: None  . Alcohol Use: None  . Drug Use: None  . Sexual Activity: None   Other Topics Concern  . None   Social History Narrative  . None    Her Allergies Are:  Allergies  Allergen Reactions  . Ciprofloxacin   . Cymbalta [Duloxetine Hcl]   . Nitrofurantoin Macrocrystal   . Sulfa Antibiotics   . Mobic [Meloxicam] Itching and Swelling  :   Her Current Medications Are:  Outpatient Encounter Prescriptions as of 11/13/2013  Medication Sig  . ALPRAZolam (XANAX) 0.5 MG tablet Take 0.5 mg by mouth 3 (three) times daily as needed for sleep.  . butalbital-acetaminophen-caffeine (FIORICET, ESGIC) 50-325-40 MG per tablet Take 1 tablet by mouth 2 (two) times daily as needed for headache.  . carbidopa-levodopa (SINEMET IR) 25-100 MG per tablet Take 1 tablet by mouth 3 (three) times daily.  Marland Kitchen denosumab (PROLIA) 60 MG/ML SOLN injection Inject 60 mg into the skin every 6 (six) months. Administer in upper arm, thigh, or abdomen  . escitalopram (LEXAPRO) 10 MG tablet Take 10 mg by mouth daily.  . lansoprazole (PREVACID) 15 MG capsule Take 15 mg by mouth daily.  Marland Kitchen levothyroxine (SYNTHROID, LEVOTHROID) 75 MCG tablet Take 75 mcg by mouth daily.  Marland Kitchen oxybutynin (DITROPAN) 5 MG tablet Take 5 mg by mouth as directed.  . pramipexole (MIRAPEX) 1.5 MG tablet Take 1/2 pill 4 times a day: at 9am, 11am, 4pm and 8pm  . Vitamin D, Ergocalciferol, (DRISDOL) 50000 UNITS CAPS Take 50,000 Units by mouth.  . lovastatin (MEVACOR) 20 MG tablet Take 20 mg by mouth at bedtime.  :  Review of Systems:  Out of a complete 14 point review of systems, all are reviewed and negative with the exception of these symptoms as listed below:  Review of Systems  Constitutional: Negative.   HENT: Negative.   Eyes: Negative.   Respiratory: Negative.   Cardiovascular: Negative.   Gastrointestinal: Negative.   Endocrine: Negative.   Genitourinary: Negative.   Musculoskeletal: Negative.   Skin: Negative.    Allergic/Immunologic: Negative.   Neurological: Negative.   Hematological: Negative.   Psychiatric/Behavioral: Negative.   All other systems reviewed and are negative.    Objective:  Neurologic Exam  Physical Exam Physical Examination:   Filed Vitals:   11/13/13 1149  BP: 120/68  Pulse: 59  Temp: 96.1 F (35.6 C)    General Examination: The patient is a very pleasant 78 y.o. female in no acute distress.  HEENT: Normocephalic, atraumatic, pupils are equal, round and reactive to light and accommodation. Extraocular tracking shows mild saccadic breakdown and mild limitation to upgaze. She had bilateral cataract repairs. There is no nystagmus. Hearing is grossly intact. Face is symmetric with minimal facial masking noted. Speech is clear with no dysarthria noted. Speech is mildly hypophonic. There is no lip,  neck or jaw tremor. Neck is very mildly rigid with full range of motion. Oropharynx exam reveals normal findings except for very mild mouth dryness. No significant airway crowding is noted. Mallampati is class II. Tongue protrudes centrally and palate elevates symmetrically.  Chest: is clear to auscultation without wheezing, rhonchi or crackles noted.  Heart: sounds are normal without murmurs, rubs or gallops noted.  Abdomen: is soft, non-tender and non-distended with normal bowel sounds appreciated on auscultation.  Extremities: There is no pitting edema in the distal lower extremities bilaterally. Pedal pulses are intact.  Skin: is warm and dry with no trophic changes noted. Mildly patchy dry skin is noted.  Musculoskeletal: exam reveals no obvious joint deformities, tenderness or joint swelling or erythema.  Neurologically:  Mental status: The patient is awake, alert and oriented in all 4 spheres. Her memory, attention, language and knowledge are appropriate. There is no aphasia, agnosia, apraxia or anomia. Speech is clear with normal prosody and enunciation, but mild  hypophonia noted. Thought process is linear. Mood is congruent. Affect is normal.  Cranial nerves are as described above under HEENT exam. In addition, shoulder shrug is normal with equal shoulder height noted. Motor exam: She has a mild R leg dyskinesia. Normal bulk, and strength is noted. Tone is very mildly increased throughout. There is no drift, or rebound. He has a mild, intermittent R hand resting tremor and a slight, intermittent R foot tremor. Romberg is negative. Reflexes are 2+ to 3+ throughout, toes are downgoing. Fine motor skills are mildly impaired, right more so than left. This includes finger taps, hand movements, rapid alternating patting, foot taps and foot agility.  Cerebellar testing shows no dysmetria or intention tremor on finger to nose testing. There is no truncal or gait ataxia.  Sensory exam is intact to light touch, pinprick, vibration, temperature sense.  She stands up without problems and does not need any assistance. Posture is very mildly stooped for age. She walks with good pace but decreased stride length and slightly decreased arm swing on the right. She turns well. Balance is preserved.               Assessment and Plan:    In summary, GLENDORIS NODARSE is a very pleasant 78 year old female with a long-standing history of idiopathic Parkinson's disease, right-sided predominant with stable findings. She has not really progressed in the last 6 months. She is tolerating her medications but has been taking her Sinemet with her meals. Previously she had some issues with nausea. I did ask her to try to take her Sinemet an hour before or 2 hours after meals if possible. I again talked to them about maintaining a healthy lifestyle in general. I encouraged the patient to eat healthy, exercise daily and keep well hydrated, to keep a scheduled bedtime and wake time routine, to not skip any meals and eat healthy snacks in between meals.  I recommended the following at this time:  Continue current medication regimen with the above change in timing of the C/L,  and try to exercise daily. I answered all their questions today and the patient and her husband were in agreement. I would like to see her back in 6 months, sooner if the need arises and encouraged them to call with any interim questions, concerns, problems or updates and refill requests. I renewed her Sinemet and pramipexole prescriptions today for 90 days with 3 refills.

## 2014-05-16 ENCOUNTER — Encounter (INDEPENDENT_AMBULATORY_CARE_PROVIDER_SITE_OTHER): Payer: Self-pay

## 2014-05-16 ENCOUNTER — Ambulatory Visit (INDEPENDENT_AMBULATORY_CARE_PROVIDER_SITE_OTHER): Payer: Medicare Other | Admitting: Neurology

## 2014-05-16 ENCOUNTER — Encounter: Payer: Self-pay | Admitting: Neurology

## 2014-05-16 VITALS — BP 126/58 | HR 59 | Ht <= 58 in | Wt 102.2 lb

## 2014-05-16 DIAGNOSIS — F3289 Other specified depressive episodes: Secondary | ICD-10-CM

## 2014-05-16 DIAGNOSIS — G20A1 Parkinson's disease without dyskinesia, without mention of fluctuations: Secondary | ICD-10-CM

## 2014-05-16 DIAGNOSIS — F32A Depression, unspecified: Secondary | ICD-10-CM

## 2014-05-16 DIAGNOSIS — G249 Dystonia, unspecified: Secondary | ICD-10-CM

## 2014-05-16 DIAGNOSIS — F329 Major depressive disorder, single episode, unspecified: Secondary | ICD-10-CM

## 2014-05-16 DIAGNOSIS — G20 Parkinson's disease: Secondary | ICD-10-CM

## 2014-05-16 DIAGNOSIS — R279 Unspecified lack of coordination: Secondary | ICD-10-CM

## 2014-05-16 NOTE — Patient Instructions (Signed)
I think your Parkinson's disease has remained fairly stable, which is reassuring. Nevertheless, as you know, this disease does progress with time. It can affect your balance, your memory, your mood, your bowel and bladder function, your posture, balance and walking. Overall you are doing fairly well but I do want to suggest a few things today:  Remember to drink plenty of fluid, eat healthy meals and do not skip any meals. Try to eat protein with a every meal and eat a healthy snack such as fruit or nuts in between meals. Try to keep a regular sleep-wake schedule and try to exercise daily, particularly in the form of walking, 20-30 minutes a day, if you can.   Taking your medication on schedule is key.   Try to stay active physically and mentally. Engage in social activities in your community and with your family and try to keep up with current events by reading the newspaper or watching the news. Try to do word puzzles and you may like to do word puzzles and brain games on the computer such as on [email or web address]/.   As far as your medications are concerned, I would like to suggest that you take your current medication with the following additional changes: try to take your carbidopa-levodopa away from your mealtimes, that is, ideally either one hour before or 2 hours after your meal to ensure optimal absorption. The medication can interfere with the protein content of your meal and trying to the protein in your food and therefore not get fully absorbed.  Common side effects reported are: Nausea, vomiting, sedation, confusion, lightheadedness. Rare side effects include hallucinations, severe nausea or vomiting, diarrhea and significant drop in blood pressure especially when going from lying to standing or from sitting to standing.     As far as diagnostic testing, I will order: no new test needed from my end of things.    I would like to see you back in 6 months, sooner if we need to. Please call us with  any interim questions, concerns, problems, updates or refill requests.  Our phone number is [phone]. We also have an after hours call service for urgent matters and there is a physician on-call for urgent questions, that cannot wait till the next work day. For any emergencies you know to call 911 or go to the nearest emergency room.

## 2014-05-16 NOTE — Progress Notes (Signed)
Subjective:    Patient ID: Judy Holt is a 78 y.o. female.  HPI    Interim history:   Judy Holt is a very pleasant 78 year old right-handed woman with an underlying medical history of depression, anxiety, migraine headaches, hypothyroidism, reflux disease, hyperlipidemia, and vitamin D deficiency, status post left wrist surgery after a fall resulting in left wrist fracture, s/p cataract repairs in 4/09, who presents for followup consultation of her right-sided predominant Parkinson's disease, complicated by mild intermittent dyskinesias. She is accompanied by her husband again today. I last saw her on 11/13/2013 at which time I encouraged her to exercise regularly and take her levodopa away from her mealtimes for better absorption. She reported that she had bladder suspension surgery at [facility] around 09/26/13. She stayed overnight for one night. She reported no recent falls. She was sleeping well. She was taking Xanax 0.5 mg each night, occasionally in the day for anxiety and stress. Her migraines were stable. She and her husband both felt that she was quite stable.   Today, she reports feeling stable. She sleeps well. Her memory is stable. She still drives. Her husband has not noted any issues with her driving however, when they ride somewhere together, he primarily drives. She has had no issues with confusion or hallucinations. She takes Sinemet 3 times a day And takes Mirapex half a pill 4 times a day. She has had no recent falls.  I saw her on 05/10/2013, at which time and felt she was for the most part stable and I did not suggest to change her medications. I did encourage her to exercise regularly.  I first met her on 11/07/2012 at which time I continued her on the same medications and encouraged her to exercise regularly.  She previously followed with Dr. Erling [name] for the past several years since her Dx about 10 years ago, and was last seen by him on 06/15/2012 at which time he  felt she was doing well. He had noticed mild dyskinesias of her right foot. He encouraged her to exercise. She is maintained on a combination of carbidopa-levodopa 25/100 mg strength one pill at 9 AM, 1 tablet 11 AM, and half a pill at 4 PM and pramipexole 1.5 mg tablets half a tablet 4 times a day, namely at 9, 11, 4 PM and 8 PM. She stopped Lovastatin, and stated that her lipids were perfect. She has not had any falls, but has occasional balance issues. She fell when she was still working and broke her L wrist and required emergent surgery. This was in the 90s. She takes oxybutynin as needed for bladder urgency. She fell 2 years ago in the kitchen, but did not hit her head.  Her symptoms began on the right side with hand and arm tremor. She has had side effects from medications including nausea. Because of her nausea with Sinemet, Mirapex was added. She has not had her medicine changed in several years. Her major manifestation of Parkinson's disease is R hand and foot tremor. She denies daytime sleepiness, compulsive behavior, postural dizziness or swelling in the legs. She has depression and memory loss, and denies hallucinations. She takes a nap for 30 minutes after lunch 3 times per week. She is independent in her ADLs and drives a car. She is not in an exercise program. She does not use a cane or a walker. Her falls assessment tool score was 9. She says her memory is not as good as it has been. She was  on sertraline for her depression and also tried cymbalta in the past. She is on Lexapro 10 mg for her depression and for her anxiety she is on Xanax 0.5 mg qHS. She can take up to 2 a day. Her PCP is Judy Peace, NP at [facility].   Her Past Medical History Is Significant For: Past Medical History  Diagnosis Date  . Depression   . Anxiety   . Migraines   . Parkinson's disease   . Hypothyroidism   . Acid reflux disease   . Unspecified vitamin D deficiency 11/07/2012    Her Past Surgical History Is  Significant For: Past Surgical History  Procedure Laterality Date  . Bladder  09/2013    bladder tac    Her Family History Is Significant For: No family history on file.  Her Social History Is Significant For: History   Social History  . Marital Status: Married    Spouse Name: N/A    Number of Children: 2  . Years of Education: 12+   Social History Main Topics  . Smoking status: Never Smoker   . Smokeless tobacco: Never Used  . Alcohol Use: No  . Drug Use: No  . Sexual Activity: None   Other Topics Concern  . None   Social History Narrative   Patient is married with 2 children.   Patient is right handed.   Patient has high school education.   Patient drinks 4 cups daily.    Her Allergies Are:  Allergies  Allergen Reactions  . Ciprofloxacin   . Cymbalta [Duloxetine Hcl]   . Nitrofurantoin Macrocrystal   . Sulfa Antibiotics   . Mobic [Meloxicam] Itching and Swelling  :   Her Current Medications Are:  Outpatient Encounter Prescriptions as of 05/16/2014  Medication Sig  . ALPRAZolam (XANAX) 0.5 MG tablet Take 0.5 mg by mouth 3 (three) times daily as needed for sleep.  . butalbital-acetaminophen-caffeine (FIORICET, ESGIC) 50-325-40 MG per tablet Take 1 tablet by mouth 2 (two) times daily as needed for headache.  . carbidopa-levodopa (SINEMET IR) 25-100 MG per tablet Take 1 tablet by mouth 3 (three) times daily.  Marland Kitchen denosumab (PROLIA) 60 MG/ML SOLN injection Inject 60 mg into the skin every 6 (six) months. Administer in upper arm, thigh, or abdomen  . escitalopram (LEXAPRO) 10 MG tablet Take 10 mg by mouth daily.  . lansoprazole (PREVACID) 15 MG capsule Take 15 mg by mouth daily.  Marland Kitchen levothyroxine (SYNTHROID, LEVOTHROID) 75 MCG tablet Take 75 mcg by mouth daily.  Marland Kitchen lovastatin (MEVACOR) 20 MG tablet Take 20 mg by mouth at bedtime.  Marland Kitchen oxybutynin (DITROPAN) 5 MG tablet Take 5 mg by mouth as directed.  . pramipexole (MIRAPEX) 1.5 MG tablet Take 1/2 pill 4 times a day: at  9am, 11am, 4pm and 8pm  . traMADol-acetaminophen (ULTRACET) 37.5-325 MG per tablet Take by mouth daily as needed.  . Vitamin D, Ergocalciferol, (DRISDOL) 50000 UNITS CAPS Take 50,000 Units by mouth.  :  Review of Systems:  Out of a complete 14 point review of systems, all are reviewed and negative with the exception of these symptoms as listed below:   Review of Systems  Constitutional: Negative.   HENT: Negative.   Eyes: Negative.   Respiratory: Negative.   Cardiovascular: Negative.   Gastrointestinal: Negative.   Endocrine: Negative.   Genitourinary: Negative.   Musculoskeletal: Negative.   Skin: Negative.   Allergic/Immunologic: Negative.   Neurological: Negative.   Hematological: Negative.   Psychiatric/Behavioral: Negative.  All other systems reviewed and are negative.   Objective:  Neurologic Exam  Physical Exam Physical Examination:   Filed Vitals:   05/16/14 1127  BP: 126/58  Pulse: 59    General Examination: The patient is a very pleasant 78 y.o. female in no acute distress.  HEENT: Normocephalic, atraumatic, pupils are equal, round and reactive to light and accommodation. Extraocular tracking shows mild saccadic breakdown and mild limitation to upgaze. She had bilateral cataract repairs.  funduscopic exam is normal. There is no nystagmus. Hearing is grossly intact. Face is symmetric with minimal facial masking noted. Speech is clear with no dysarthria noted. Speech is mildly hypophonic. There is a slight intermittent lower lip tremor. Neck is very mildly rigid with full range of motion. Oropharynx exam reveals normal findings except for very mild mouth dryness. No significant airway crowding is noted. Mallampati is class II. Tongue protrudes centrally and palate elevates symmetrically.  Chest: is clear to auscultation without wheezing, rhonchi or crackles noted.  Heart: sounds are normal without murmurs, rubs or gallops noted.  Abdomen: is soft, non-tender and  non-distended with normal bowel sounds appreciated on auscultation.  Extremities: There is no pitting edema in the distal lower extremities bilaterally. Pedal pulses are intact.  Skin: is warm and dry with no trophic changes noted. Mildly patchy dry skin is noted.  Musculoskeletal: exam reveals no obvious joint deformities, tenderness or joint swelling or erythema.  Neurologically:  Mental status: The patient is awake, alert and oriented in all 4 spheres. Her memory, attention, language and knowledge are appropriate. There is no aphasia, agnosia, apraxia or anomia. Speech is clear with normal prosody and enunciation, but mild hypophonia noted. Thought process is linear. Mood is congruent. Affect is normal.  Cranial nerves are as described above under HEENT exam. In addition, shoulder shrug is normal with equal shoulder height noted. Motor exam: She has a mild R leg dyskinesia, which is intermittent. Normal bulk, and strength is noted. Tone is very mildly increased throughout. There is no drift, or rebound. He has a mild, intermittent R hand resting tremor and a slight, intermittent R foot tremor. Romberg is negative. Reflexes are 2+ to 3+ throughout, toes are downgoing. Fine motor skills are mildly impaired, right more so than left. This includes finger taps, hand movements, rapid alternating patting, foot taps and foot agility.  Cerebellar testing shows no dysmetria or intention tremor on finger to nose testing. There is no truncal or gait ataxia.  Sensory exam is intact to light touch, pinprick, vibration, temperature sense.  She stands up without problems and does not need any assistance. Posture is very mildly stooped for age. She walks with good pace but decreased stride length and slightly decreased arm swing on the right. She turns well. Balance is fairly well preserved.               Assessment and Plan:    In summary, CALIYAH SIEH is a very pleasant 78 year old female with a  long-standing history of idiopathic Parkinson's disease, right-sided predominant with stable findings. She has not really progressed in the last several months. She is tolerating her medications but has been taking her Sinemet with her meals. Previously she had some issues with nausea and reports that this was due to her hiatal hernia. I did ask her to try to take her Sinemet an hour before or 2 hours after meals if possible. I again talked to them about maintaining a healthy lifestyle in general. I encouraged  the patient to eat healthy, exercise daily and keep well hydrated, to keep a scheduled bedtime and wake time routine, to not skip any meals and eat healthy snacks in between meals. I asked her to increase her water intake as she primarily likes to drink sweet tea or hot tea. I recommended the following at this time: Continue current medication regimen with the above change in timing of the C/L, and try to exercise daily. We will also keep the Mirapex at half a pill 4 times a day. I answered all their questions today and the patient and her husband were in agreement. I would like to see her back in 6 months, sooner if the need arises and encouraged them to call with any interim questions, concerns, problems or updates and refill requests.

## 2014-09-04 DIAGNOSIS — Z9181 History of falling: Secondary | ICD-10-CM | POA: Diagnosis not present

## 2014-09-04 DIAGNOSIS — D539 Nutritional anemia, unspecified: Secondary | ICD-10-CM | POA: Diagnosis not present

## 2014-09-04 DIAGNOSIS — M810 Age-related osteoporosis without current pathological fracture: Secondary | ICD-10-CM | POA: Diagnosis not present

## 2014-09-04 DIAGNOSIS — Z23 Encounter for immunization: Secondary | ICD-10-CM | POA: Diagnosis not present

## 2014-09-04 DIAGNOSIS — E785 Hyperlipidemia, unspecified: Secondary | ICD-10-CM | POA: Diagnosis not present

## 2014-09-04 DIAGNOSIS — Z1389 Encounter for screening for other disorder: Secondary | ICD-10-CM | POA: Diagnosis not present

## 2014-11-14 ENCOUNTER — Ambulatory Visit: Payer: Medicare Other | Admitting: Neurology

## 2014-12-03 ENCOUNTER — Ambulatory Visit (INDEPENDENT_AMBULATORY_CARE_PROVIDER_SITE_OTHER): Payer: Medicare Other | Admitting: Neurology

## 2014-12-03 ENCOUNTER — Encounter: Payer: Self-pay | Admitting: Neurology

## 2014-12-03 VITALS — BP 108/60 | HR 60 | Resp 12 | Ht <= 58 in | Wt 100.0 lb

## 2014-12-03 DIAGNOSIS — F329 Major depressive disorder, single episode, unspecified: Secondary | ICD-10-CM

## 2014-12-03 DIAGNOSIS — G20 Parkinson's disease: Secondary | ICD-10-CM

## 2014-12-03 DIAGNOSIS — R109 Unspecified abdominal pain: Secondary | ICD-10-CM

## 2014-12-03 DIAGNOSIS — K30 Functional dyspepsia: Secondary | ICD-10-CM

## 2014-12-03 DIAGNOSIS — G249 Dystonia, unspecified: Secondary | ICD-10-CM

## 2014-12-03 DIAGNOSIS — F419 Anxiety disorder, unspecified: Secondary | ICD-10-CM

## 2014-12-03 DIAGNOSIS — F32A Depression, unspecified: Secondary | ICD-10-CM

## 2014-12-03 DIAGNOSIS — K449 Diaphragmatic hernia without obstruction or gangrene: Secondary | ICD-10-CM | POA: Diagnosis not present

## 2014-12-03 MED ORDER — ESCITALOPRAM OXALATE 10 MG PO TABS: 15.0000 mg | ORAL_TABLET | Freq: Every day | ORAL | Status: DC

## 2014-12-03 MED ORDER — CARBIDOPA-LEVODOPA 25-100 MG PO TABS: 1.0000 | ORAL_TABLET | Freq: Three times a day (TID) | ORAL | Status: DC

## 2014-12-03 MED ORDER — PRAMIPEXOLE DIHYDROCHLORIDE 1.5 MG PO TABS: ORAL_TABLET | ORAL | Status: DC

## 2014-12-03 NOTE — Patient Instructions (Addendum)
I think your Parkinson's disease has remained fairly stable, which is reassuring. Nevertheless, as you know, this disease does progress with time. It can affect your balance, your memory, your mood, your bowel and bladder function, your posture, balance and walking. Overall you are doing fairly well but I do want to suggest a few things today:  Remember to drink plenty of fluid, eat healthy meals and do not skip any meals. Try to eat protein with a every meal and eat a healthy snack such as fruit or nuts in between meals. Try to keep a regular sleep-wake schedule and try to exercise daily, particularly in the form of walking, 20-30 minutes a day, if you can.   Taking your medication on schedule is key.   Try to stay active physically and mentally. Engage in social activities in your community and with your family and try to keep up with current events by reading the newspaper or watching the news. Try to do word puzzles and you may like to do word puzzles and brain games on the computer such as on [email or web address]/.   As far as your medications are concerned, I would like to suggest that you take your current medication with the following additional changes: 1. We will continue with sinemet 25/100 mg at 3 times a day. 2. We will increase your lexapro 10 mg to 1 1/2 pills daily to help with your depression. You can switch to bedtime if it makes you sleepy. 3. We will reduced your pramipexole 1.5 mg to 1/2 three times a day, along with your sinemet due help your sleepiness. 4. Please talk to your primary care physician or GI doctor about your stomach pain and taking Advil on a daily basis, which can be harsh on your stomach lining.    I would like to see you back in 6 months, sooner if we need to. Please call us with any interim questions, concerns, problems, updates or refill requests.  Our phone number is [phone]. We also have an after hours call service for urgent matters and there is a physician  on-call for urgent questions, that cannot wait till the next work day. For any emergencies you know to call 911 or go to the nearest emergency room.

## 2014-12-03 NOTE — Progress Notes (Signed)
Subjective:    Patient ID: Judy Holt is a 79 y.o. female.  HPI     Interim history:  Ms. Judy Holt is a very pleasant 80 year old right-handed woman with an underlying medical history of depression, anxiety, migraine headaches, hypothyroidism, reflux disease, hyperlipidemia, and vitamin D deficiency, status post left wrist surgery after a fall resulting in left wrist fracture, s/p cataract repairs in 4/09, who presents for followup consultation of her right-sided predominant Parkinson's disease, complicated by mild intermittent dyskinesias. She is accompanied by her daughter, Judy Holt, her younger daughter today. I last saw her on 05/16/2014, at which time she reported feeling stable. She was sleeping well and felt she had stable memory. She was still driving. She had no hallucinations, she was taking Sinemet 3 times a day and half a pill of Mirapex 4 times a day. She reported no recent falls. I encouraged her to take Sinemet away from her mealtimes to ensure better absorption. I did not change her medication regimen otherwise.  Today, 12/03/2014: She is able to provide her own history. Her daughter provides additional information. She reports having issues with her stomach, ongoing. She has a Hx of hiatal hernia which was diagnosed 2 years ago. According to the patient and her daughter her hernia is large and she was not advised to go through with any surgical procedure. She has been on Prevacid every day. She takes Advil for stomach pain once or twice daily. She takes it every day. She has been prescribed tramadol but could not tolerate it and she had nausea and diarrhea with it. It did not help her pain. She sees a GI doctor in [place]. She is sleepy during the day according to her daughter. She may fall asleep for an extended period of time. She tries to drink water. Sinemet makes her nauseated. She has mild dyskinesias and has had visual illusions but not frank hallucinations. She is primarily  bothered by her stomach pain including cramping and she feels that there are knots sometimes she can feel in her abdomen. For anxiety she takes Xanax but primarily takes it at night to help her sleep. Very occasionally she will take a daytime dose. She has had more depressive symptoms and sometimes is frankly tearful. She has been on generic Lexapro 10 mg for years.  Previously:   I saw her on 11/13/2013 at which time I encouraged her to exercise regularly and take her levodopa away from her mealtimes for better absorption. She reported that she had bladder suspension surgery at [facility] around 09/26/13. She stayed overnight for one night. She reported no recent falls. She was sleeping well. She was taking Xanax 0.5 mg each night, occasionally in the day for anxiety and stress. Her migraines were stable. She and her husband both felt that she was quite stable.   I saw her on 05/10/2013, at which time and felt she was for the most part stable and I did not suggest to change her medications. I did encourage her to exercise regularly.   I first met her on 11/07/2012 at which time I continued her on the same medications and encouraged her to exercise regularly.   She previously followed with Dr. Erling [name] for the past several years since her Dx about 10 years ago, and was last seen by him on 06/15/2012 at which time he felt she was doing well. He had noticed mild dyskinesias of her right foot. He encouraged her to exercise. She is maintained on a combination of  carbidopa-levodopa 25/100 mg strength one pill at 9 AM, 1 tablet 11 AM, and half a pill at 4 PM and pramipexole 1.5 mg tablets half a tablet 4 times a day, namely at 9, 11, 4 PM and 8 PM. She stopped Lovastatin, and stated that her lipids were perfect. She has not had any falls, but has occasional balance issues. She fell when she was still working and broke her L wrist and required emergent surgery. This was in the 90s. She takes oxybutynin as needed  for bladder urgency. She fell 2 years ago in the kitchen, but did not hit her head.   Her symptoms began on the right side with hand and arm tremor. She has had side effects from medications including nausea. Because of her nausea with Sinemet, Mirapex was added. She has not had her medicine changed in several years. Her major manifestation of Parkinson's disease is R hand and foot tremor. She denies daytime sleepiness, compulsive behavior, postural dizziness or swelling in the legs. She has depression and memory loss, and denies hallucinations. She takes a nap for 30 minutes after lunch 3 times per week. She is independent in her ADLs and drives a car. She is not in an exercise program. She does not use a cane or a walker. Her falls assessment tool score was 9. She says her memory is not as good as it has been. She was on sertraline for her depression and also tried cymbalta in the past. She is on Lexapro 10 mg for her depression and for her anxiety she is on Xanax 0.5 mg qHS. She can take up to 2 a day. Her PCP is Laverna Peace, NP at [facility].   Her Past Medical History Is Significant For: Past Medical History  Diagnosis Date  . Depression   . Anxiety   . Migraines   . Parkinson's disease   . Hypothyroidism   . Acid reflux disease   . Unspecified vitamin D deficiency 11/07/2012    Her Past Surgical History Is Significant For: Past Surgical History  Procedure Laterality Date  . Bladder  09/2013    bladder tac    Her Family History Is Significant For: Family History  Problem Relation Age of Onset  . Alzheimer's disease Mother   . Cancer Father     Her Social History Is Significant For: History   Social History  . Marital Status: Married    Spouse Name: N/A  . Number of Children: 2  . Years of Education: 12+   Social History Main Topics  . Smoking status: Never Smoker   . Smokeless tobacco: Never Used  . Alcohol Use: No  . Drug Use: No  . Sexual Activity: Not on file   Other  Topics Concern  . None   Social History Narrative   Patient is married with 2 children.   Patient is right handed.   Patient has high school education.   Patient drinks 4 cups daily.    Her Allergies Are:  Allergies  Allergen Reactions  . Ciprofloxacin   . Cymbalta [Duloxetine Hcl]   . Nitrofurantoin Macrocrystal   . Sulfa Antibiotics   . Mobic [Meloxicam] Itching and Swelling  :   Her Current Medications Are:  Outpatient Encounter Prescriptions as of 12/03/2014  Medication Sig  . ALPRAZolam (XANAX) 0.5 MG tablet Take 0.5 mg by mouth 3 (three) times daily as needed for sleep.  . butalbital-acetaminophen-caffeine (FIORICET, ESGIC) 50-325-40 MG per tablet Take 1 tablet by mouth 2 (  two) times daily as needed for headache.  . carbidopa-levodopa (SINEMET IR) 25-100 MG per tablet Take 1 tablet by mouth 3 (three) times daily.  Marland Kitchen denosumab (PROLIA) 60 MG/ML SOLN injection Inject 60 mg into the skin every 6 (six) months. Administer in upper arm, thigh, or abdomen  . escitalopram (LEXAPRO) 10 MG tablet Take 10 mg by mouth daily.  . lansoprazole (PREVACID) 15 MG capsule Take 15 mg by mouth daily.  Marland Kitchen levothyroxine (SYNTHROID, LEVOTHROID) 75 MCG tablet Take 75 mcg by mouth daily.  Marland Kitchen lovastatin (MEVACOR) 20 MG tablet Take 20 mg by mouth at bedtime.  Marland Kitchen oxybutynin (DITROPAN) 5 MG tablet Take 5 mg by mouth as directed.  . pramipexole (MIRAPEX) 1.5 MG tablet Take 1/2 pill 4 times a day: at 9am, 11am, 4pm and 8pm  . Vitamin D, Ergocalciferol, (DRISDOL) 50000 UNITS CAPS Take 50,000 Units by mouth.  . [DISCONTINUED] traMADol-acetaminophen (ULTRACET) 37.5-325 MG per tablet Take by mouth daily as needed.  :  Review of Systems:  Out of a complete 14 point review of systems, all are reviewed and negative with the exception of these symptoms as listed below:   Review of Systems  Constitutional: Positive for fatigue.  Gastrointestinal:       Hiatal hernia with acid reflux, causes her to be unable to  stand straight.     Objective:  Neurologic Exam  Physical Exam Physical Examination:   Filed Vitals:   12/03/14 0958  BP: 108/60  Pulse: 60  Resp: 12    General Examination: The patient is a very pleasant 79 y.o. female in no acute distress.  HEENT: Normocephalic, atraumatic, pupils are equal, round and reactive to light and accommodation. Extraocular tracking shows mild saccadic breakdown and mild limitation to upgaze. She had bilateral cataract repairs.  funduscopic exam is normal. There is no nystagmus. Hearing is grossly intact. Face is symmetric with minimal facial masking noted. Speech is clear with no dysarthria noted. Speech is mildly hypophonic. There is a slight intermittent lower lip tremor. Neck is very mildly rigid with full range of motion. Oropharynx exam reveals normal findings except for mild mouth dryness. No significant airway crowding is noted. Mallampati is class II. Tongue protrudes centrally and palate elevates symmetrically.  Chest: is clear to auscultation without wheezing, rhonchi or crackles noted.  Heart: sounds are normal without murmurs, rubs or gallops noted.  Abdomen: is soft, non-tender and non-distended with normal bowel sounds appreciated on auscultation. I palpated her abdomen carefully and did not notice any lumps or bumps or knots, as the patient was concerned that she has abnormalities.  Extremities: There is no pitting edema in the distal lower extremities bilaterally. Pedal pulses are intact.  Skin: is warm and dry with no trophic changes noted. Mildly patchy dry skin is noted.  Musculoskeletal: exam reveals no obvious joint deformities, tenderness or joint swelling or erythema.  Neurologically:  Mental status: The patient is awake, alert and oriented in all 4 spheres. Her memory, attention, language and knowledge are appropriate. There is no aphasia, agnosia, apraxia or anomia. Speech is clear with normal prosody and enunciation, but mild  hypophonia noted. Thought process is linear. Mood is congruent. Affect is normal.  Cranial nerves are as described above under HEENT exam. In addition, shoulder shrug is normal with equal shoulder height noted. Motor exam: She has a mild R leg dyskinesia and mild intermittent generalized dyskinesias. Normal bulk, and strength is noted. Tone is very mildly increased throughout. There is no drift, or  rebound. He has a mild, intermittent R hand resting tremor and a slight, intermittent R foot tremor. Romberg is negative. Reflexes are 2+ throughout, toes are downgoing. Fine motor skills are mildly impaired, right more so than left. This includes finger taps, hand movements, rapid alternating patting, foot taps and foot agility.  Cerebellar testing shows no dysmetria or intention tremor on finger to nose testing. There is no truncal or gait ataxia.  Sensory exam is intact to light touch, pinprick, vibration, temperature sense.  She stands up without problems and does not need any assistance. Posture is very mildly stooped for age. She walks with good pace but decreased stride length and slightly decreased arm swing on the right. She turns well. Balance is fairly well preserved.                Assessment and Plan:    In summary, PINA SIRIANNI is a very pleasant 79 year old female with a long-standing history of idiopathic Parkinson's disease, right-sided predominant with stable findings. She has not really progressed in the last several months, motor wise, but has had some issues with nausea and sometimes vomiting, particularly because of her hiatal hernia issue. She has had stomach pain and cramping and takes Advil every day which concerns me. I've asked her to make an appointment with her GI doctor to talk about her stomach pain. She has had increased daytime somnolence and some additional residual depression symptoms. She has had dyskinesias. Today I suggested the following: For her GI problems including  and particularly because of her intermittent pain I have asked her to meet with her GI doctor again. She does not have an appointment pending as I understand. It worries me that she takes Advil every day up to twice daily. She is advised to drink more water. She is advised to continue with Sinemet 3 times a day but I would like to reduce her pramipexole to 3 times a day as well. She's taking 1.5 mg strength half a pill 4 times a day currently. I would like to reduce it secondary to her daytime somnolence, dyskinesias and occasional visual illusions. This could be secondary to medication effect. For depression have asked her to increase her Lexapro to 15 mg daily from currently 10 mg daily. I provided her with new prescriptions for Sinemet, Mirapex and Lexapro today. I would like to see her back in 6 months, sooner if the need arises. They are encouraged to call with any interim questions or concerns. I answered all her questions today and they were in agreement. I spent 25 minutes in total face-to-face time with the patient, more than 50% of which was spent in counseling and coordination of care, reviewing test results, reviewing medication and discussing or reviewing the diagnosis of PD, hiatal hernia, depression, anxiety, and we discussed different treatment options and strategies.

## 2014-12-10 DIAGNOSIS — H1859 Other hereditary corneal dystrophies: Secondary | ICD-10-CM | POA: Diagnosis not present

## 2014-12-13 DIAGNOSIS — E785 Hyperlipidemia, unspecified: Secondary | ICD-10-CM | POA: Diagnosis not present

## 2014-12-13 DIAGNOSIS — D539 Nutritional anemia, unspecified: Secondary | ICD-10-CM | POA: Diagnosis not present

## 2014-12-13 DIAGNOSIS — R1013 Epigastric pain: Secondary | ICD-10-CM | POA: Diagnosis not present

## 2014-12-13 DIAGNOSIS — E039 Hypothyroidism, unspecified: Secondary | ICD-10-CM | POA: Diagnosis not present

## 2014-12-13 DIAGNOSIS — Z6820 Body mass index (BMI) 20.0-20.9, adult: Secondary | ICD-10-CM | POA: Diagnosis not present

## 2014-12-19 ENCOUNTER — Encounter: Payer: Self-pay | Admitting: Gastroenterology

## 2015-01-10 DIAGNOSIS — Z9181 History of falling: Secondary | ICD-10-CM | POA: Diagnosis not present

## 2015-01-10 DIAGNOSIS — D539 Nutritional anemia, unspecified: Secondary | ICD-10-CM | POA: Diagnosis not present

## 2015-01-10 DIAGNOSIS — R42 Dizziness and giddiness: Secondary | ICD-10-CM | POA: Diagnosis not present

## 2015-01-10 DIAGNOSIS — F329 Major depressive disorder, single episode, unspecified: Secondary | ICD-10-CM | POA: Diagnosis not present

## 2015-01-10 DIAGNOSIS — Z1389 Encounter for screening for other disorder: Secondary | ICD-10-CM | POA: Diagnosis not present

## 2015-01-10 DIAGNOSIS — Z139 Encounter for screening, unspecified: Secondary | ICD-10-CM | POA: Diagnosis not present

## 2015-01-10 DIAGNOSIS — R1013 Epigastric pain: Secondary | ICD-10-CM | POA: Diagnosis not present

## 2015-02-11 ENCOUNTER — Ambulatory Visit: Payer: Self-pay | Admitting: Gastroenterology

## 2015-02-27 ENCOUNTER — Encounter: Payer: Self-pay | Admitting: Gastroenterology

## 2015-02-27 ENCOUNTER — Ambulatory Visit (INDEPENDENT_AMBULATORY_CARE_PROVIDER_SITE_OTHER): Payer: Medicare Other | Admitting: Gastroenterology

## 2015-02-27 VITALS — BP 122/56 | HR 69 | Ht 60.0 in | Wt 95.1 lb

## 2015-02-27 DIAGNOSIS — K219 Gastro-esophageal reflux disease without esophagitis: Secondary | ICD-10-CM | POA: Diagnosis not present

## 2015-02-27 DIAGNOSIS — Z86010 Personal history of colonic polyps: Secondary | ICD-10-CM | POA: Diagnosis not present

## 2015-02-27 DIAGNOSIS — K589 Irritable bowel syndrome without diarrhea: Secondary | ICD-10-CM

## 2015-02-27 DIAGNOSIS — E611 Iron deficiency: Secondary | ICD-10-CM

## 2015-02-27 MED ORDER — DICYCLOMINE HCL 10 MG PO CAPS: 10.0000 mg | ORAL_CAPSULE | Freq: Three times a day (TID) | ORAL | Status: DC

## 2015-02-27 MED ORDER — OMEPRAZOLE 40 MG PO CPDR: 40.0000 mg | DELAYED_RELEASE_CAPSULE | Freq: Two times a day (BID) | ORAL | Status: DC

## 2015-02-27 MED ORDER — NA SULFATE-K SULFATE-MG SULF 17.5-3.13-1.6 GM/177ML PO SOLN: 1.0000 | Freq: Once | ORAL | Status: DC

## 2015-02-27 NOTE — Patient Instructions (Signed)
We have sent the following medications to your pharmacy for you to pick up at your convenience:Bentyl and omeprazole.  You have been scheduled for an endoscopy and colonoscopy. Please follow the written instructions given to you at your visit today. Please pick up your prep supplies at the pharmacy within the next 1-3 days. If you use inhalers (even only as needed), please bring them with you on the day of your procedure. Your physician has requested that you go to www.startemmi.com and enter the access code given to you at your visit today. This web site gives a general overview about your procedure. However, you should still follow specific instructions given to you by our office regarding your preparation for the procedure.  Thank you for choosing me and [facility].  Pricilla Riffle. Dagoberto Ligas., MD., Marval Regal  cc: Laverna Peace, NP

## 2015-02-27 NOTE — Progress Notes (Signed)
History of Present Illness: This is a 12 her old female referred by Laverna Peace, NP for the evaluation of GERD, IBS and iron deficiency anemia. Pt is accompanied by her daughter. The daughter provides some history and the patient provides some history but both can provide only limited information. Unfortunately she did not come with records from her prior gastroenterologist however we were able to obtain records after she left the office. She has been followed by Dr. Lyndel Safe in [place] and the patient's daughter relates she has been diagnosed with a GU, large hiatal hernia, GERD, IBS with lower abdominal pain and alternating diarrhea and constipation. IBS symptoms have been present for years and have not substantially changed. She not remained on acid suppression therapy. It was restarted recently with dexlansoprazole 60 mg for 2 weeks with very good control of her reflux symptoms. Esomeprazole 20 mg daily was then used which has not controlled her reflux symptoms adequately. She was recently found to have an iron deficiency anemia with hemoglobin of 10 and iron saturation 9%. Review of records from Dr. Lyndel Safe shows a CT and from July 2015 showing a small to moderate hiatal hernia is unremarkable. EGD performed in June 2012 showing gastritis, 7 cm hiatal hernia and healed gastric ulcer. CLOtest negative duodenal biopsies normal. EGD performed in March 2012 showed a 1 cm gastric ulcer just distal to the esophagogastric junction. A colonoscopy performed in March 2010 for follow-up of adenomatous colon polyps showed internal hemorrhoids and was otherwise normal to the terminal ileum. Denies weight loss, change in stool caliber, melena, hematochezia, nausea, vomiting, dysphagia, chest pain.  Review of Systems: Pertinent positive and negative review of systems were noted in the above HPI section. All other review of systems were otherwise negative.  Current Medications, Allergies, Past Medical History, Past  Surgical History, Family History and Social History were reviewed in [employer] record.  Physical Exam: General: Well developed, well nourished, thin, elderly, no acute distress Head: Normocephalic and atraumatic Eyes:  sclerae anicteric, EOMI Ears: Normal auditory acuity Mouth: No deformity or lesions Neck: Supple, no masses or thyromegaly Lungs: Clear throughout to auscultation Heart: Regular rate and rhythm; no murmurs, rubs or bruits Abdomen: Soft, mild lower abdominal tenderness and non distended. No masses, hepatosplenomegaly or hernias noted. Normal Bowel sounds Rectal: deferred to colonoscopy Musculoskeletal: Symmetrical with no gross deformities  Skin: No lesions on visible extremities Pulses:  Normal pulses noted Extremities: No clubbing, cyanosis, edema or deformities noted Neurological: Alert oriented x 4, grossly nonfocal Cervical Nodes:  No significant cervical adenopathy Inguinal Nodes: No significant inguinal adenopathy Psychological:  Alert and cooperative. Normal mood and affect  Assessment and Recommendations:  1. Iron deficiency anemia, personal history of adenomatous colon polyps, history of gastric ulcer. Schedule colonoscopy and upper endoscopy. Continue Fe replacement and follow up of anemia per PCP. The risks (including bleeding, perforation, infection, missed lesions, medication reactions and possible hospitalization or surgery if complications occur), benefits, and alternatives to colonoscopy with possible biopsy and possible polypectomy were discussed with the patient and they consent to proceed. The risks (including bleeding, perforation, infection, missed lesions, medication reactions and possible hospitalization or surgery if complications occur), benefits, and alternatives to endoscopy with possible biopsy and possible dilation were discussed with the patient and they consent to proceed.   2. Chronic GERD with a large hiatal hernia.  Discontinue Nexium OTC and begin omeprazole 40 mg by mouth twice a day along with standard antireflux measures. She will need  to remain on a prescription strength PPI long-term for adequate control of GERD symptoms to help prevent recurrent ulcers.  3. IBS with alternating diarrhea and constipation and lower abdominal pain. Trial of dicyclomine 10 mg 3 times a day as needed.  cc: Laverna Peace, NP [address] [place], [place] [address]

## 2015-03-11 DIAGNOSIS — H40053 Ocular hypertension, bilateral: Secondary | ICD-10-CM | POA: Diagnosis not present

## 2015-03-12 DIAGNOSIS — M810 Age-related osteoporosis without current pathological fracture: Secondary | ICD-10-CM | POA: Diagnosis not present

## 2015-03-20 DIAGNOSIS — M810 Age-related osteoporosis without current pathological fracture: Secondary | ICD-10-CM | POA: Diagnosis not present

## 2015-03-20 DIAGNOSIS — E785 Hyperlipidemia, unspecified: Secondary | ICD-10-CM | POA: Diagnosis not present

## 2015-03-20 DIAGNOSIS — K219 Gastro-esophageal reflux disease without esophagitis: Secondary | ICD-10-CM | POA: Diagnosis not present

## 2015-03-20 DIAGNOSIS — F411 Generalized anxiety disorder: Secondary | ICD-10-CM | POA: Diagnosis not present

## 2015-03-20 DIAGNOSIS — D539 Nutritional anemia, unspecified: Secondary | ICD-10-CM | POA: Diagnosis not present

## 2015-04-10 ENCOUNTER — Ambulatory Visit (AMBULATORY_SURGERY_CENTER): Payer: Medicare Other | Admitting: Gastroenterology

## 2015-04-10 ENCOUNTER — Encounter: Payer: Self-pay | Admitting: Gastroenterology

## 2015-04-10 VITALS — BP 137/72 | HR 71 | Temp 97.6°F | Resp 21 | Ht 60.0 in | Wt 95.0 lb

## 2015-04-10 DIAGNOSIS — E611 Iron deficiency: Secondary | ICD-10-CM | POA: Diagnosis not present

## 2015-04-10 DIAGNOSIS — Z86010 Personal history of colonic polyps: Secondary | ICD-10-CM | POA: Diagnosis not present

## 2015-04-10 DIAGNOSIS — K219 Gastro-esophageal reflux disease without esophagitis: Secondary | ICD-10-CM

## 2015-04-10 DIAGNOSIS — D125 Benign neoplasm of sigmoid colon: Secondary | ICD-10-CM | POA: Diagnosis not present

## 2015-04-10 DIAGNOSIS — K210 Gastro-esophageal reflux disease with esophagitis: Secondary | ICD-10-CM | POA: Diagnosis not present

## 2015-04-10 DIAGNOSIS — K208 Other esophagitis: Secondary | ICD-10-CM | POA: Diagnosis not present

## 2015-04-10 MED ORDER — SODIUM CHLORIDE 0.9 % IV SOLN: 500.0000 mL | INTRAVENOUS | Status: DC

## 2015-04-10 NOTE — Progress Notes (Signed)
Called to room to assist during endoscopic procedure.  Patient ID and intended procedure confirmed with present staff. Received instructions for my participation in the procedure from the performing physician.  

## 2015-04-10 NOTE — Patient Instructions (Signed)
YOU HAD AN ENDOSCOPIC PROCEDURE TODAY AT [facility]:   Refer to the procedure report that was given to you for any specific questions about what was found during the examination.  If the procedure report does not answer your questions, please call your gastroenterologist to clarify.  If you requested that your care partner not be given the details of your procedure findings, then the procedure report has been included in a sealed envelope for you to review at your convenience later.  YOU SHOULD EXPECT: Some feelings of bloating in the abdomen. Passage of more gas than usual.  Walking can help get rid of the air that was put into your GI tract during the procedure and reduce the bloating. If you had a lower endoscopy (such as a colonoscopy or flexible sigmoidoscopy) you may notice spotting of blood in your stool or on the toilet paper. If you underwent a bowel prep for your procedure, you may not have a normal bowel movement for a few days.  Please Note:  You might notice some irritation and congestion in your nose or some drainage.  This is from the oxygen used during your procedure.  There is no need for concern and it should clear up in a day or so.  SYMPTOMS TO REPORT IMMEDIATELY:   Following lower endoscopy (colonoscopy or flexible sigmoidoscopy):  Excessive amounts of blood in the stool  Significant tenderness or worsening of abdominal pains  Swelling of the abdomen that is new, acute  Fever of 100F or higher    For urgent or emergent issues, a gastroenterologist can be reached at any hour by calling [phone].   DIET: Your first meal following the procedure should be a small meal and then it is ok to progress to your normal diet. Heavy or fried foods are harder to digest and may make you feel nauseous or bloated.  Likewise, meals heavy in dairy and vegetables can increase bloating.  Drink plenty of fluids but you should avoid alcoholic beverages for 24  hours.  ACTIVITY:  You should plan to take it easy for the rest of today and you should NOT DRIVE or use heavy machinery until tomorrow (because of the sedation medicines used during the test).    FOLLOW UP: Our staff will call the number listed on your records the next business day following your procedure to check on you and address any questions or concerns that you may have regarding the information given to you following your procedure. If we do not reach you, we will leave a message.  However, if you are feeling well and you are not experiencing any problems, there is no need to return our call.  We will assume that you have returned to your regular daily activities without incident.  If any biopsies were taken you will be contacted by phone or by letter within the next 1-3 weeks.  Please call us at [phone] if you have not heard about the biopsies in 3 weeks.    SIGNATURES/CONFIDENTIALITY: You and/or your care partner have signed paperwork which will be entered into your electronic medical record.  These signatures attest to the fact that that the information above on your After Visit Summary has been reviewed and is understood.  Full responsibility of the confidentiality of this discharge information lies with you and/or your care-partner.  Hiatal hernia and esophagitis information given. Anti-reflux regimen given. Continue PPI, long term.  Polyp information given Hold aspirin and NSAIDS for 2  weeks

## 2015-04-10 NOTE — Op Note (Signed)
[place]  [address]. [place], [address]   COLONOSCOPY PROCEDURE REPORT  PATIENT: Judy Holt, Judy Holt  MR#: [id number] BIRTHDATE: [birth year] , 66  yrs. old GENDER: female ENDOSCOPIST: Ladene Artist, MD, [facility] REFERRED BY: Laverna Peace, NP PROCEDURE DATE:  04/10/2015 PROCEDURE:   Colonoscopy, diagnostic, Colonoscopy with biopsy, and Colonoscopy with snare polypectomy First Screening Colonoscopy - Avg.  risk and is 50 yrs.  old or older - No.  Prior Negative Screening - Now for repeat screening. N/A  History of Adenoma - Now for follow-up colonoscopy & has been > or = to 3 yrs.  Yes hx of adenoma.  Has been 3 or more years since last colonoscopy.  Polyps removed today? Yes ASA CLASS:   Class III INDICATIONS:Unexplained iron deficiency anemia, Surveillance due to prior colonic neoplasia, and PH Colon Adenoma. MEDICATIONS: Monitored anesthesia care and Propofol 300 mg IV DESCRIPTION OF PROCEDURE:   After the risks benefits and alternatives of the procedure were thoroughly explained, informed consent was obtained.  The digital rectal exam revealed no abnormalities of the rectum.   The LB PCF Q180 [id number]  endoscope was introduced through the anus and advanced to the cecum, which was identified by both the appendix and ileocecal valve. No adverse events experienced with a tortuous and redundant colon.   The quality of the prep was adequate (Suprep was used)  The instrument was then slowly withdrawn as the colon was fully examined. Estimated blood loss is zero unless otherwise noted in this procedure report.    COLON FINDINGS: Two sessile polyps measuring 6 and 5 mm in size were found in the sigmoid colon.  Polypectomies were performed with a cold snare and with cold forceps, respectively.  The resection was complete, the polyp tissue was completely retrieved and sent to histology.   The examination was otherwise normal.  Retroflexed views revealed no abnormalities.  The time to cecum = 7.9 Withdrawal time = 13.9   The scope was withdrawn and the procedure completed. COMPLICATIONS: There were no immediate complications.  ENDOSCOPIC IMPRESSION: 1.   Two sessile polyps in the sigmoid colon; polypectomies performed with a cold snare and with cold forceps 2.   The examination was otherwise normal  RECOMMENDATIONS: 1.  Hold Aspirin and all other NSAIDS for 2 weeks. 2.  Await pathology results 3.  Given your age, you will not need another colonoscopy for colon cancer screening or polyp surveillance.  These types of tests usually stop around the age 85.  eSigned:  Ladene Artist, MD, [facility] 04/10/2015 2:37 PM

## 2015-04-10 NOTE — Progress Notes (Signed)
Transferred to recovery room. A/O x3, pleased with MAC.  VSS.  Report to Jane, RN. 

## 2015-04-10 NOTE — Op Note (Signed)
[place]  [address]. [place], [address]   ENDOSCOPY PROCEDURE REPORT  PATIENT: Judy Holt, Judy Holt  MR#: [id number] BIRTHDATE: [birth year] , 29  yrs. old GENDER: female ENDOSCOPIST: Ladene Artist, MD, [facility] REFERRED BY:  Laverna Peace, NP PROCEDURE DATE:  04/10/2015 PROCEDURE:  EGD w/ biopsy ASA CLASS:     Class III INDICATIONS:  history of esophageal reflux and iron deficiency anemia. MEDICATIONS: Monitored anesthesia care, Residual sedation present, and Propofol 100 mg IV TOPICAL ANESTHETIC: none DESCRIPTION OF PROCEDURE: After the risks benefits and alternatives of the procedure were thoroughly explained, informed consent was obtained.  The LB [id number] endoscope was introduced through the mouth and advanced to the second portion of the duodenum , Without limitations.  The instrument was slowly withdrawn as the mucosa was fully examined.    ESOPHAGUS: There was LA Class C esophagitis (Mucosal breaks continuous between > 2 mucosal folds, but involving less than 75% of the esophageal circumference) noted.  Multiple biopsies were performed.  The esophagus was tortuous and mildly dilated, otherwise appeared normal. STOMACH: The mucosa and folds of the stomach appeared normal. DUODENUM: The duodenal mucosa showed no abnormalities in the bulb and 2nd part of the duodenum.  Retroflexed views revealed a large hiatal hernia, 7 cm.  The scope was then withdrawn from the patient and the procedure completed.  COMPLICATIONS: There were no immediate complications.  ENDOSCOPIC IMPRESSION: 1.   LA Class C esophagitis; multiple biopsies performed 2.   Large hiatal hernia  RECOMMENDATIONS: 1.  Anti-reflux regimen long term 2.  Await pathology results 3.  Continue PPI bid long term  eSigned:  Ladene Artist, MD, [facility] 04/10/2015 2:49 PM

## 2015-04-11 ENCOUNTER — Telehealth: Payer: Self-pay | Admitting: *Deleted

## 2015-04-11 NOTE — Telephone Encounter (Signed)
  Follow up Call-  Call back number 04/10/2015  Post procedure Call Back phone  # [phone]  Permission to leave phone message Yes     Patient questions:  Do you have a fever, pain , or abdominal swelling? No. Pain Score  0 *  Have you tolerated food without any problems? Yes.    Have you been able to return to your normal activities? Yes.    Do you have any questions about your discharge instructions: Diet   No. Medications  No. Follow up visit  No.  Do you have questions or concerns about your Care? No.  Actions: * If pain score is 4 or above: No action needed, pain <4.

## 2015-04-12 ENCOUNTER — Telehealth: Payer: Self-pay | Admitting: Neurology

## 2015-04-12 NOTE — Telephone Encounter (Signed)
Pt called about Rx pramipexole (MIRAPEX) 1.5 MG tablet. She states that the instruction on the bottle are wrong - half a pill 4 times a day . It needs to be half a pill 3 times a day. I looked at her last office visit and it stated that she was to reduce to half a pill 3 times a day. Pt needs refill

## 2015-04-12 NOTE — Telephone Encounter (Signed)
It appears a Rx with dose on 1/2 three times daily was sent to the pharmacy at [place].  I called the pharmacy.  Pharmacist verified they do have the current dose on file, and will fill the Rx today.  Says the patient never requested they fill this Rx.  I called the patient back.  Says she forgot they had the new Rx at the pharmacy, and they will stop by to pick it up later today.

## 2015-04-15 ENCOUNTER — Encounter: Payer: Self-pay | Admitting: Gastroenterology

## 2015-04-26 DIAGNOSIS — Z6820 Body mass index (BMI) 20.0-20.9, adult: Secondary | ICD-10-CM | POA: Diagnosis not present

## 2015-04-26 DIAGNOSIS — R42 Dizziness and giddiness: Secondary | ICD-10-CM | POA: Diagnosis not present

## 2015-05-01 DIAGNOSIS — H9071 Mixed conductive and sensorineural hearing loss, unilateral, right ear, with unrestricted hearing on the contralateral side: Secondary | ICD-10-CM | POA: Diagnosis not present

## 2015-05-01 DIAGNOSIS — H8112 Benign paroxysmal vertigo, left ear: Secondary | ICD-10-CM | POA: Diagnosis not present

## 2015-05-01 DIAGNOSIS — H9042 Sensorineural hearing loss, unilateral, left ear, with unrestricted hearing on the contralateral side: Secondary | ICD-10-CM | POA: Diagnosis not present

## 2015-06-05 ENCOUNTER — Ambulatory Visit: Payer: Medicare Other | Admitting: Neurology

## 2015-06-07 DIAGNOSIS — R42 Dizziness and giddiness: Secondary | ICD-10-CM | POA: Diagnosis not present

## 2015-06-10 DIAGNOSIS — H40053 Ocular hypertension, bilateral: Secondary | ICD-10-CM | POA: Diagnosis not present

## 2015-06-10 DIAGNOSIS — R42 Dizziness and giddiness: Secondary | ICD-10-CM | POA: Insufficient documentation

## 2015-06-19 ENCOUNTER — Telehealth: Payer: Self-pay

## 2015-06-19 ENCOUNTER — Ambulatory Visit (INDEPENDENT_AMBULATORY_CARE_PROVIDER_SITE_OTHER): Payer: Medicare Other | Admitting: Nurse Practitioner

## 2015-06-19 ENCOUNTER — Ambulatory Visit: Payer: Medicare Other | Admitting: Neurology

## 2015-06-19 ENCOUNTER — Encounter: Payer: Self-pay | Admitting: Nurse Practitioner

## 2015-06-19 VITALS — BP 93/53 | HR 63 | Ht <= 58 in | Wt 101.5 lb

## 2015-06-19 DIAGNOSIS — F32A Depression, unspecified: Secondary | ICD-10-CM

## 2015-06-19 DIAGNOSIS — G20A1 Parkinson's disease without dyskinesia, without mention of fluctuations: Secondary | ICD-10-CM

## 2015-06-19 DIAGNOSIS — R413 Other amnesia: Secondary | ICD-10-CM

## 2015-06-19 DIAGNOSIS — G20 Parkinson's disease: Secondary | ICD-10-CM | POA: Diagnosis not present

## 2015-06-19 DIAGNOSIS — F329 Major depressive disorder, single episode, unspecified: Secondary | ICD-10-CM

## 2015-06-19 NOTE — Telephone Encounter (Signed)
I spoke to patient and let her know that Dr. Rexene Alberts is out sick today. She was willing to see Judy Sauer NP today. I rescheduled with Judy Holt.

## 2015-06-19 NOTE — Progress Notes (Signed)
[facility]  PATIENT: Judy Holt DOB: 1935-11-06   REASON FOR VISIT: Follow-up for Parkinson's disease, dyskinesias due to Parkinson's, depression, new complaint of memory loss HISTORY FROM: Patient and daughter    HISTORY OF PRESENT ILLNESS: HISTORY:[name] is a very pleasant 79 year old right-handed woman with an underlying medical history of depression, anxiety, migraine headaches, hypothyroidism, reflux disease, hyperlipidemia, and vitamin D deficiency, status post left wrist surgery after a fall resulting in left wrist fracture, s/p cataract repairs in 4/09, who presents for followup consultation of her right-sided predominant Parkinson's disease, complicated by mild intermittent dyskinesias. She is accompanied by her daughter, Judy Holt, her younger daughter today. I last saw her on 05/16/2014, at which time she reported feeling stable. She was sleeping well and felt she had stable memory. She was still driving. She had no hallucinations, she was taking Sinemet 3 times a day and half a pill of Mirapex 4 times a day. She reported no recent falls. I encouraged her to take Sinemet away from her mealtimes to ensure better absorption. I did not change her medication regimen otherwise.  Today, 12/03/2014: She is able to provide her own history. Her daughter provides additional information. She reports having issues with her stomach, ongoing. She has a Hx of hiatal hernia which was diagnosed 2 years ago. According to the patient and her daughter her hernia is large and she was not advised to go through with any surgical procedure. She has been on Prevacid every day. She takes Advil for stomach pain once or twice daily. She takes it every day. She has been prescribed tramadol but could not tolerate it and she had nausea and diarrhea with it. It did not help her pain. She sees a GI doctor in [place]. She is sleepy during the day according to her daughter. She may fall asleep for  an extended period of time. She tries to drink water. Sinemet makes her nauseated. She has mild dyskinesias and has had visual illusions but not frank hallucinations. She is primarily bothered by her stomach pain including cramping and she feels that there are knots sometimes she can feel in her abdomen. For anxiety she takes Xanax but primarily takes it at night to help her sleep. Very occasionally she will take a daytime dose. She has had more depressive symptoms and sometimes is frankly tearful. She has been on generic Lexapro 10 mg for years. UPDATE 06/19/15 CMMs. [name] 79 year old female returns for follow-up. She has a history of Parkinson's disease for approximately 12 years. She is currently on Sinemet and Mirapex, she recently had to come off of her medications for a couple of days to have vertigo testing. Her major manifestation of her Parkinson's disease is right hand and arm tremor and occasional right foot tremor. She has had intermittent dyskinesias. She denies any daytime sleepiness, any compulsive behaviors, any postural dizziness. She has not fallen. She does not use an assistive device. She gets no regular exercise She has a history of depression and new complaints of memory loss today but denies any hallucinations. She was on Lexapro when  last seen and that has been changed to Prozac. She is independent in all activities of daily living and continues to drive a car without getting lost. She was recently diagnosed with vertigo and is due to start some rehabilitation service . She returns for reevaluation REVIEW OF SYSTEMS: Full 14 system review of systems performed and notable only for those listed, all others are neg:  Constitutional:  neg  Cardiovascular: neg Ear/Nose/Throat: neg  Skin: neg Eyes: Light sensitivity  Respiratory: neg Gastroitestinal: neg  Hematology/Lymphatic: neg  Endocrine: neg Musculoskeletal:neg Allergy/Immunology: neg Neurological: Memory loss Psychiatric:  Depression and anxiety Sleep : neg   ALLERGIES: Allergies  Allergen Reactions  . Ciprofloxacin   . Cymbalta [Duloxetine Hcl]   . Nitrofurantoin Macrocrystal   . Sulfa Antibiotics   . Mobic [Meloxicam] Itching and Swelling    HOME MEDICATIONS: Outpatient Prescriptions Prior to Visit  Medication Sig Dispense Refill  . ALPRAZolam (XANAX) 0.5 MG tablet Take 0.5 mg by mouth 3 (three) times daily as needed for sleep.    . carbidopa-levodopa (SINEMET IR) 25-100 MG per tablet Take 1 tablet by mouth 3 (three) times daily. 270 tablet 3  . denosumab (PROLIA) 60 MG/ML SOLN injection Inject 60 mg into the skin every 6 (six) months. Administer in upper arm, thigh, or abdomen    . dicyclomine (BENTYL) 10 MG capsule Take 1 capsule (10 mg total) by mouth 4 (four) times daily -  before meals and at bedtime. 90 capsule 11  . Iron-FA-B Cmp-C-Biot-Probiotic (FUSION PLUS) CAPS     . levothyroxine (SYNTHROID, LEVOTHROID) 75 MCG tablet Take 75 mcg by mouth daily.    Marland Kitchen omeprazole (PRILOSEC) 40 MG capsule Take 1 capsule (40 mg total) by mouth 2 (two) times daily. 60 capsule 11  . oxybutynin (DITROPAN) 5 MG tablet Take 5 mg by mouth as directed.    . pramipexole (MIRAPEX) 1.5 MG tablet Take 1/2 pill 3 times a day: at 9am, 1 pm and 8pm 135 tablet 3  . butalbital-acetaminophen-caffeine (FIORICET, ESGIC) 50-325-40 MG per tablet Take 1 tablet by mouth 2 (two) times daily as needed for headache.    Marland Kitchen FLUoxetine (PROZAC) 10 MG capsule     . lovastatin (MEVACOR) 20 MG tablet Take 20 mg by mouth at bedtime.    . meclizine (ANTIVERT) 25 MG tablet     . Vitamin D, Ergocalciferol, (DRISDOL) 50000 UNITS CAPS Take 50,000 Units by mouth.     No facility-administered medications prior to visit.    PAST MEDICAL HISTORY: Past Medical History  Diagnosis Date  . Depression   . Anxiety   . Migraines   . Parkinson's disease ([place])   . Hypothyroidism   . Acid reflux disease   . Unspecified vitamin D deficiency 11/07/2012    . Anemia   . Hyperlipidemia   . Osteoporosis   . Insomnia   . IBS (irritable bowel syndrome)   . Chronic obstructive airway disease ([place])     PAST SURGICAL HISTORY: Past Surgical History  Procedure Laterality Date  . Bladder  09/2013    bladder tac  . Abdominal hysterectomy    . Left arm surgery  2004    FAMILY HISTORY: Family History  Problem Relation Age of Onset  . Alzheimer's disease Mother   . Cancer Father   . Colon cancer Neg Hx     SOCIAL HISTORY: Social History   Social History  . Marital Status: Married    Spouse Name: N/A  . Number of Children: 2  . Years of Education: 12+   Occupational History  . Retired    Social History Main Topics  . Smoking status: Never Smoker   . Smokeless tobacco: Never Used  . Alcohol Use: No  . Drug Use: No  . Sexual Activity: Not on file   Other Topics Concern  . Not on file   Social History Narrative   Patient is  married with 2 children.   Patient is right handed.   Patient has high school education.   Patient drinks 4 cups daily.     PHYSICAL EXAM  Filed Vitals:   06/19/15 1451  BP: 93/53  Pulse: 63  Height: 4\' 10"  (1.473 m)  Weight: 101 lb 8 oz (46.04 kg)   Body mass index is 21.22 kg/(m^2).  Generalized: Well developed, in no acute distress, minimal masking of the face  Head: normocephalic and atraumatic,. Oropharynx benign  Neck: Supple, no carotid bruits  Cardiac: Regular rate rhythm, no murmur  Musculoskeletal: No deformity   Neurological examination   Mentation: Alert oriented to time, place, history taking.MMSE 27/30. AFT 11. Clock drawing 4/4. GDS=6.  Attention span and concentration appropriate.   Follows all commands speech mildly hypophonic and language fluent.   Cranial nerve II-XII: Fundoscopic exam reveals sharp disc margins.Pupils were equal round reactive to light extraocular movements were full except for decreased upgaze. , visual field were full on confrontational test. Facial  sensation and strength were normal. hearing was intact to finger rubbing bilaterally. Uvula tongue midline. head turning and shoulder shrug were normal and symmetric.Tongue protrusion into cheek strength was normal. Negative Myerson sign  Motor: normal bulk and tone, mild right hand resting tremor which is intermittent, mild intermittent right dyskinesia of the right ,fine finger movements are mildly impaired right more than left this includes finger taps and foot taps and hand movements . No focal weakness Sensory: normal and symmetric to light touch, pinprick, and  Vibration, proprioception  Coordination: finger-nose-finger, heel-to-shin bilaterally, no dysmetria Reflexes: Brachioradialis 2/2, biceps 2/2, triceps 2/2, patellar 2/2, Achilles 2/2, plantar responses were flexor bilaterally. Gait and Station: Rising up from seated position without assistance, stooped posture ambulated 210 feet in the hall, no difficulty with turns mild decreased arm swing on the right.   DIAGNOSTIC DATA (LABS, IMAGING, TESTING) - ASSESSMENT AND PLAN  79 y.o. year old female  has a past medical history of Depression; idiopathic Parkinson's disease for 12 years, and mild memory loss.MMSE 27/30. AFT 11. Clock drawing 4/4. GDS=6.  Parkinson symptoms seem to be in good control at present. The patient is a current patient of Dr. Rexene Alberts  who is out of the office today . This note is sent to the work in doctor.     Continue Mirapex at current dose Continue Carbo dopa at current dose Memory score is stable will follow over time Given suggestions for exercises for the memory such as cross words ,strategy games etc. Encouraged exercise by walking on a daily basis at least 30 minutes, stay well hydrated For her depression she is to continue her Prozac 20 mg daily I spent 25 minutes in total face-to-face time with the patient daughter with 50% of which was spent counseling and coordination of care reviewing medications and  discussing her diagnosis of Parkinson's disease depression memory loss and anxiety and options to manage these.  F/U in 6 months Dennie Bible, [facility], [facility], APRN  [facility] [address], [place], [place] [address] [phone]

## 2015-06-19 NOTE — Patient Instructions (Signed)
Continue Mirapex at current dose Continue Carbo dopa at current dose Get some exercise F/U in 6 months

## 2015-06-20 NOTE — Progress Notes (Signed)
I agree with the assessment and plan as directed by NP .The patient is known to me .   Anja Neuzil, MD  

## 2015-06-24 DIAGNOSIS — R740 Nonspecific elevation of levels of transaminase and lactic acid dehydrogenase [LDH]: Secondary | ICD-10-CM | POA: Diagnosis not present

## 2015-06-24 DIAGNOSIS — J449 Chronic obstructive pulmonary disease, unspecified: Secondary | ICD-10-CM | POA: Diagnosis not present

## 2015-06-24 DIAGNOSIS — D539 Nutritional anemia, unspecified: Secondary | ICD-10-CM | POA: Diagnosis not present

## 2015-06-24 DIAGNOSIS — E785 Hyperlipidemia, unspecified: Secondary | ICD-10-CM | POA: Diagnosis not present

## 2015-06-24 DIAGNOSIS — F329 Major depressive disorder, single episode, unspecified: Secondary | ICD-10-CM | POA: Diagnosis not present

## 2015-06-24 DIAGNOSIS — E039 Hypothyroidism, unspecified: Secondary | ICD-10-CM | POA: Diagnosis not present

## 2015-07-09 ENCOUNTER — Telehealth: Payer: Self-pay | Admitting: Gastroenterology

## 2015-07-09 NOTE — Telephone Encounter (Signed)
Patient wants to know if she can stop the Bentyl and omeprazole since she is feeling better. Informed patient that she does need to continue these medications even when she is feeling better because she can start having reflux symptoms again. She had esophagitis on last EGD and had IBS at her last office visit with Dr. Fuller Plan. Informed patient if the medications are helping then she needs to continue them. Pt verbalized understanding.

## 2015-07-29 DIAGNOSIS — H8112 Benign paroxysmal vertigo, left ear: Secondary | ICD-10-CM | POA: Diagnosis not present

## 2015-07-29 DIAGNOSIS — R262 Difficulty in walking, not elsewhere classified: Secondary | ICD-10-CM | POA: Diagnosis not present

## 2015-08-06 DIAGNOSIS — H8112 Benign paroxysmal vertigo, left ear: Secondary | ICD-10-CM | POA: Diagnosis not present

## 2015-08-06 DIAGNOSIS — R262 Difficulty in walking, not elsewhere classified: Secondary | ICD-10-CM | POA: Diagnosis not present

## 2015-08-08 DIAGNOSIS — H8112 Benign paroxysmal vertigo, left ear: Secondary | ICD-10-CM | POA: Diagnosis not present

## 2015-08-08 DIAGNOSIS — R262 Difficulty in walking, not elsewhere classified: Secondary | ICD-10-CM | POA: Diagnosis not present

## 2015-08-14 DIAGNOSIS — H8112 Benign paroxysmal vertigo, left ear: Secondary | ICD-10-CM | POA: Diagnosis not present

## 2015-08-14 DIAGNOSIS — R262 Difficulty in walking, not elsewhere classified: Secondary | ICD-10-CM | POA: Diagnosis not present

## 2015-08-23 DIAGNOSIS — R262 Difficulty in walking, not elsewhere classified: Secondary | ICD-10-CM | POA: Diagnosis not present

## 2015-08-23 DIAGNOSIS — H8112 Benign paroxysmal vertigo, left ear: Secondary | ICD-10-CM | POA: Diagnosis not present

## 2015-08-29 DIAGNOSIS — H8112 Benign paroxysmal vertigo, left ear: Secondary | ICD-10-CM | POA: Diagnosis not present

## 2015-08-29 DIAGNOSIS — R262 Difficulty in walking, not elsewhere classified: Secondary | ICD-10-CM | POA: Diagnosis not present

## 2015-09-13 DIAGNOSIS — M810 Age-related osteoporosis without current pathological fracture: Secondary | ICD-10-CM | POA: Diagnosis not present

## 2015-10-08 DIAGNOSIS — D539 Nutritional anemia, unspecified: Secondary | ICD-10-CM | POA: Diagnosis not present

## 2015-10-08 DIAGNOSIS — E559 Vitamin D deficiency, unspecified: Secondary | ICD-10-CM | POA: Diagnosis not present

## 2015-10-08 DIAGNOSIS — E039 Hypothyroidism, unspecified: Secondary | ICD-10-CM | POA: Diagnosis not present

## 2015-10-08 DIAGNOSIS — E785 Hyperlipidemia, unspecified: Secondary | ICD-10-CM | POA: Diagnosis not present

## 2015-11-07 ENCOUNTER — Other Ambulatory Visit: Payer: Self-pay

## 2015-11-07 ENCOUNTER — Telehealth: Payer: Self-pay

## 2015-11-07 DIAGNOSIS — G249 Dystonia, unspecified: Secondary | ICD-10-CM

## 2015-11-07 DIAGNOSIS — K219 Gastro-esophageal reflux disease without esophagitis: Secondary | ICD-10-CM

## 2015-11-07 DIAGNOSIS — F329 Major depressive disorder, single episode, unspecified: Secondary | ICD-10-CM

## 2015-11-07 DIAGNOSIS — G20 Parkinson's disease: Secondary | ICD-10-CM

## 2015-11-07 DIAGNOSIS — F32A Depression, unspecified: Secondary | ICD-10-CM

## 2015-11-07 DIAGNOSIS — K589 Irritable bowel syndrome without diarrhea: Secondary | ICD-10-CM

## 2015-11-07 DIAGNOSIS — E611 Iron deficiency: Secondary | ICD-10-CM

## 2015-11-07 MED ORDER — DICYCLOMINE HCL 10 MG PO CAPS: 10.0000 mg | ORAL_CAPSULE | Freq: Three times a day (TID) | ORAL | Status: DC

## 2015-11-07 MED ORDER — PRAMIPEXOLE DIHYDROCHLORIDE 1.5 MG PO TABS: ORAL_TABLET | ORAL | Status: DC

## 2015-11-07 MED ORDER — CARBIDOPA-LEVODOPA 25-100 MG PO TABS: 1.0000 | ORAL_TABLET | Freq: Three times a day (TID) | ORAL | Status: DC

## 2015-11-07 MED ORDER — OMEPRAZOLE 40 MG PO CPDR: 40.0000 mg | DELAYED_RELEASE_CAPSULE | Freq: Two times a day (BID) | ORAL | Status: DC

## 2015-11-07 NOTE — Telephone Encounter (Signed)
Medication refill request.

## 2015-11-19 DIAGNOSIS — R636 Underweight: Secondary | ICD-10-CM | POA: Diagnosis not present

## 2015-11-19 DIAGNOSIS — E039 Hypothyroidism, unspecified: Secondary | ICD-10-CM | POA: Diagnosis not present

## 2015-11-19 DIAGNOSIS — Z1389 Encounter for screening for other disorder: Secondary | ICD-10-CM | POA: Diagnosis not present

## 2015-11-19 DIAGNOSIS — E559 Vitamin D deficiency, unspecified: Secondary | ICD-10-CM | POA: Diagnosis not present

## 2015-12-09 DIAGNOSIS — H40053 Ocular hypertension, bilateral: Secondary | ICD-10-CM | POA: Diagnosis not present

## 2015-12-17 ENCOUNTER — Ambulatory Visit (INDEPENDENT_AMBULATORY_CARE_PROVIDER_SITE_OTHER): Payer: Medicare Other | Admitting: Nurse Practitioner

## 2015-12-17 ENCOUNTER — Encounter: Payer: Self-pay | Admitting: Nurse Practitioner

## 2015-12-17 VITALS — BP 96/55 | HR 67 | Ht <= 58 in | Wt 96.4 lb

## 2015-12-17 DIAGNOSIS — E559 Vitamin D deficiency, unspecified: Secondary | ICD-10-CM | POA: Diagnosis not present

## 2015-12-17 DIAGNOSIS — G20 Parkinson's disease: Secondary | ICD-10-CM

## 2015-12-17 DIAGNOSIS — R42 Dizziness and giddiness: Secondary | ICD-10-CM | POA: Diagnosis not present

## 2015-12-17 DIAGNOSIS — R413 Other amnesia: Secondary | ICD-10-CM | POA: Diagnosis not present

## 2015-12-17 NOTE — Patient Instructions (Signed)
Continue Mirapex at current dose Continue Carbo dopa at current dose Memory score is stable will follow over time Continue  exercises for the memory such as cross words ,strategy games etc. Encouraged exercise by walking on a daily basis at least 30 minutes, stay well hydrated For her depression she is to continue her Prozac 20 mg daily F/U in 6 months

## 2015-12-17 NOTE — Progress Notes (Addendum)
[facility]  PATIENT: Judy Holt DOB: [birth year]   REASON FOR VISIT: Follow-up for Parkinson's disease, memory loss HISTORY FROM: Patient and daughter    HISTORY OF PRESENT ILLNESS:HISTORY:Judy Holt is a very pleasant 80 year old right-handed woman with an underlying medical history of depression, anxiety, migraine headaches, hypothyroidism, reflux disease, hyperlipidemia, and vitamin D deficiency, status post left wrist surgery after a fall resulting in left wrist fracture, s/p cataract repairs in 4/09, who presents for followup consultation of her right-sided predominant Parkinson's disease, complicated by mild intermittent dyskinesias. She is accompanied by her daughter, Judy Holt, her younger daughter today. I last saw her on 05/16/2014, at which time she reported feeling stable. She was sleeping well and felt she had stable memory. She was still driving. She had no hallucinations, she was taking Sinemet 3 times a day and half a pill of Mirapex 4 times a day. She reported no recent falls. I encouraged her to take Sinemet away from her mealtimes to ensure better absorption. I did not change her medication regimen otherwise.  Today, 12/03/2014: She is able to provide her own history. Her daughter provides additional information. She reports having issues with her stomach, ongoing. She has a Hx of hiatal hernia which was diagnosed 2 years ago. According to the patient and her daughter her hernia is large and she was not advised to go through with any surgical procedure. She has been on Prevacid every day. She takes Advil for stomach pain once or twice daily. She takes it every day. She has been prescribed tramadol but could not tolerate it and she had nausea and diarrhea with it. It did not help her pain. She sees a GI doctor in [place]. She is sleepy during the day according to her daughter. She may fall asleep for an extended period of time. She tries to drink water. Sinemet  makes her nauseated. She has mild dyskinesias and has had visual illusions but not frank hallucinations. She is primarily bothered by her stomach pain including cramping and she feels that there are knots sometimes she can feel in her abdomen. For anxiety she takes Xanax but primarily takes it at night to help her sleep. Very occasionally she will take a daytime dose. She has had more depressive symptoms and sometimes is frankly tearful. She has been on generic Lexapro 10 mg for years. UPDATE 06/19/15 CMMs. Judy Holt 80 year old female returns for follow-up. She has a history of Parkinson's disease for approximately 12 years. She is currently on Sinemet and Mirapex, she recently had to come off of her medications for a couple of days to have vertigo testing. Her major manifestation of her Parkinson's disease is right hand and arm tremor and occasional right foot tremor. She has had intermittent dyskinesias. She denies any daytime sleepiness, any compulsive behaviors, any postural dizziness. She has not fallen. She does not use an assistive device. She gets no regular exercise She has a history of depression and new complaints of memory loss today but denies any hallucinations. She was on Lexapro when last seen and that has been changed to Prozac. She is independent in all activities of daily living and continues to drive a car without getting lost. She was recently diagnosed with vertigo and is due to start some rehabilitation service . She returns for reevaluation Update 05/02/2017CM Judy Holt 80 year old returns for follow-up. She has had Parkinson's disease for approximately 12.5 years. She is currently on Mirapex and Sinemet without side effects. She denies any daytime sleepiness  compulsive behaviors. She was out in the yard pulling weeds 3 weeks ago on a hot day and became dizzy and fell to felt to the ground no apparent injury. She was probably dehydrated. She does have a history of depression for which she  takes Prozac. She is independent in all activities of daily living she continues to drive her car without difficulty. She returns for follow-up  REVIEW OF SYSTEMS: Full 14 system review of systems performed and notable only for those listed, all others are neg:  Constitutional: Fatigue Cardiovascular: neg Ear/Nose/Throat: neg  Skin: neg Eyes: neg Respiratory: neg Gastroitestinal: neg  Hematology/Lymphatic: neg  Endocrine: neg Musculoskeletal:neg Allergy/Immunology: neg Neurological: Memory loss, dizziness tremor Psychiatric: Depression Sleep : neg   ALLERGIES: Allergies  Allergen Reactions  . Ciprofloxacin   . Cymbalta [Duloxetine Hcl]   . Nitrofurantoin Macrocrystal   . Sulfa Antibiotics   . Mobic [Meloxicam] Itching and Swelling    HOME MEDICATIONS: Outpatient Prescriptions Prior to Visit  Medication Sig Dispense Refill  . ALPRAZolam (XANAX) 0.5 MG tablet Take 0.5 mg by mouth 3 (three) times daily as needed for sleep.    . butalbital-acetaminophen-caffeine (FIORICET, ESGIC) 50-325-40 MG per tablet Take 1 tablet by mouth 2 (two) times daily as needed for headache.    . carbidopa-levodopa (SINEMET IR) 25-100 MG tablet Take 1 tablet by mouth 3 (three) times daily. 270 tablet 3  . denosumab (PROLIA) 60 MG/ML SOLN injection Inject 60 mg into the skin every 6 (six) months. Administer in upper arm, thigh, or abdomen    . dicyclomine (BENTYL) 10 MG capsule Take 1 capsule (10 mg total) by mouth 4 (four) times daily -  before meals and at bedtime. 270 capsule 1  . FLUoxetine (PROZAC) 20 MG capsule TK ONE C PO  QHS  3  . Iron-FA-B Cmp-C-Biot-Probiotic (FUSION PLUS) CAPS     . levothyroxine (SYNTHROID, LEVOTHROID) 75 MCG tablet Take 75 mcg by mouth daily.    Marland Kitchen omeprazole (PRILOSEC) 40 MG capsule Take 1 capsule (40 mg total) by mouth 2 (two) times daily. 180 capsule 1  . oxybutynin (DITROPAN) 5 MG tablet Take 5 mg by mouth as directed.    . pramipexole (MIRAPEX) 1.5 MG tablet Take 1/2  pill 3 times a day: at 9am, 1 pm and 8pm 135 tablet 3   No facility-administered medications prior to visit.    PAST MEDICAL HISTORY: Past Medical History  Diagnosis Date  . Depression   . Anxiety   . Migraines   . Parkinson's disease ([place])   . Hypothyroidism   . Acid reflux disease   . Unspecified vitamin D deficiency 11/07/2012  . Anemia   . Hyperlipidemia   . Osteoporosis   . Insomnia   . IBS (irritable bowel syndrome)   . Chronic obstructive airway disease ([place])     PAST SURGICAL HISTORY: Past Surgical History  Procedure Laterality Date  . Bladder  09/2013    bladder tac  . Abdominal hysterectomy    . Left arm surgery  2004    FAMILY HISTORY: Family History  Problem Relation Age of Onset  . Alzheimer's disease Mother   . Cancer Father   . Colon cancer Neg Hx     SOCIAL HISTORY: Social History   Social History  . Marital Status: Married    Spouse Name: N/A  . Number of Children: 2  . Years of Education: 12+   Occupational History  . Retired    Social History Main Topics  .  Smoking status: Never Smoker   . Smokeless tobacco: Never Used  . Alcohol Use: No  . Drug Use: No  . Sexual Activity: Not on file   Other Topics Concern  . Not on file   Social History Narrative   Patient is married with 2 children.   Patient is right handed.   Patient has high school education.   Patient drinks 4 cups daily.     PHYSICAL EXAM  Filed Vitals:   12/17/15 1353  BP: 96/55  Pulse: 67  Height: 4\' 10"  (1.473 m)  Weight: 96 lb 6.4 oz (43.727 kg)   Body mass index is 20.15 kg/(m^2). Generalized: Well developed, in no acute distress, minimal masking of the face  Head: normocephalic and atraumatic,. Oropharynx benign  Neck: Supple, no carotid bruits  Cardiac: Regular rate rhythm, no murmur  Musculoskeletal: No deformity   Neurological examination   Mentation: Alert oriented to time, place, history taking.MMSE 26/30. AFT 7. Clock drawing 3/4.   Attention span and concentration appropriate. Follows all commands speech mildly hypophonic and language fluent.   Cranial nerve II-XII: Fundoscopic exam reveals sharp disc margins.Pupils were equal round reactive to light extraocular movements were full except for decreased upgaze. , visual field were full on confrontational test. Facial sensation and strength were normal. hearing was intact to finger rubbing bilaterally. Uvula tongue midline. head turning and shoulder shrug were normal and symmetric.Tongue protrusion into cheek strength was normal. Negative Myerson sign  Motor: normal bulk and tone, mild right hand resting tremor which is intermittent, mild intermittent right dyskinesia of the right ,fine finger movements are mildly impaired right more than left this includes finger taps and foot taps and hand movements . No focal weakness Sensory: normal and symmetric to light touch, pinprick, and Vibration, proprioception  Coordination: finger-nose-finger, heel-to-shin bilaterally, no dysmetria Reflexes: Brachioradialis 2/2, biceps 2/2, triceps 2/2, patellar 2/2, Achilles 2/2, plantar responses were flexor bilaterally. Gait and Station: Rising up from seated position without assistance, stooped posture ambulated 210 feet in the hall, no difficulty with turns mild decreased arm swing on the right. Ambulated in 55 seconds  DIAGNOSTIC DATA (LABS, IMAGING, TESTING) - ASSESSMENT AND PLAN 80 y.o. year old female has a past medical history of Depression; idiopathic Parkinson's disease for 12.5 years, and mild memory loss.MMSE 26/30. AFT 7. Clock drawing 3/4. Marland Kitchen Parkinson symptoms seem to be in good control at present.   Continue Mirapex at current dose does not need refills Continue Sinemet at current dose, does not need refills Memory score is stable will follow over time Given suggestions for exercises for the memory such as cross words ,strategy games etc. Encouraged exercise by walking on a  daily basis at least 30 minutes, stay well hydrated For her depression she is to continue her Prozac 20 mg daily Follow-up in 6 months Dennie Bible, [facility], [facility], [facility] [address], [place], [place] [address] [phone]  I reviewed the above note and documentation by the Nurse Practitioner and agree with the history, physical exam, assessment and plan as outlined above. I was immediately available for face-to-face consultation. Star Age, MD, PhD [facility])

## 2015-12-28 ENCOUNTER — Other Ambulatory Visit: Payer: Self-pay | Admitting: Neurology

## 2016-01-01 ENCOUNTER — Other Ambulatory Visit: Payer: Self-pay | Admitting: Neurology

## 2016-01-21 DIAGNOSIS — Z6820 Body mass index (BMI) 20.0-20.9, adult: Secondary | ICD-10-CM | POA: Diagnosis not present

## 2016-01-21 DIAGNOSIS — M810 Age-related osteoporosis without current pathological fracture: Secondary | ICD-10-CM | POA: Diagnosis not present

## 2016-01-25 ENCOUNTER — Emergency Department (HOSPITAL_COMMUNITY)
Admission: EM | Admit: 2016-01-25 | Discharge: 2016-01-25 | Disposition: A | Discharge status: Home / Self Care | Payer: Medicare Other | Attending: Emergency Medicine | Admitting: Emergency Medicine

## 2016-01-25 ENCOUNTER — Emergency Department (HOSPITAL_COMMUNITY): Payer: Medicare Other

## 2016-01-25 ENCOUNTER — Encounter (HOSPITAL_COMMUNITY): Payer: Self-pay | Admitting: *Deleted

## 2016-01-25 DIAGNOSIS — E039 Hypothyroidism, unspecified: Secondary | ICD-10-CM | POA: Diagnosis not present

## 2016-01-25 DIAGNOSIS — Y939 Activity, unspecified: Secondary | ICD-10-CM | POA: Insufficient documentation

## 2016-01-25 DIAGNOSIS — S52591A Other fractures of lower end of right radius, initial encounter for closed fracture: Secondary | ICD-10-CM | POA: Diagnosis not present

## 2016-01-25 DIAGNOSIS — S6991XA Unspecified injury of right wrist, hand and finger(s), initial encounter: Secondary | ICD-10-CM | POA: Diagnosis present

## 2016-01-25 DIAGNOSIS — Y999 Unspecified external cause status: Secondary | ICD-10-CM | POA: Diagnosis not present

## 2016-01-25 DIAGNOSIS — Y929 Unspecified place or not applicable: Secondary | ICD-10-CM | POA: Insufficient documentation

## 2016-01-25 DIAGNOSIS — F329 Major depressive disorder, single episode, unspecified: Secondary | ICD-10-CM | POA: Diagnosis not present

## 2016-01-25 DIAGNOSIS — G20 Parkinson's disease: Secondary | ICD-10-CM | POA: Insufficient documentation

## 2016-01-25 DIAGNOSIS — S52501A Unspecified fracture of the lower end of right radius, initial encounter for closed fracture: Secondary | ICD-10-CM

## 2016-01-25 DIAGNOSIS — J449 Chronic obstructive pulmonary disease, unspecified: Secondary | ICD-10-CM | POA: Diagnosis not present

## 2016-01-25 DIAGNOSIS — E785 Hyperlipidemia, unspecified: Secondary | ICD-10-CM | POA: Insufficient documentation

## 2016-01-25 DIAGNOSIS — W010XXA Fall on same level from slipping, tripping and stumbling without subsequent striking against object, initial encounter: Secondary | ICD-10-CM | POA: Diagnosis not present

## 2016-01-25 DIAGNOSIS — S59201A Unspecified physeal fracture of lower end of radius, right arm, initial encounter for closed fracture: Secondary | ICD-10-CM | POA: Diagnosis not present

## 2016-01-25 MED ORDER — HYDROCODONE-ACETAMINOPHEN 5-325 MG PO TABS: 1.0000 | ORAL_TABLET | Freq: Four times a day (QID) | ORAL | Status: DC | PRN

## 2016-01-25 NOTE — ED Notes (Signed)
Pt reports falling today and landed with right arm out. Has pain to right forearm, wrist and hand. No obv injury or deformity noted.

## 2016-01-25 NOTE — Progress Notes (Signed)
Orthopedic Tech Progress Note Patient Details:  Judy Holt [birth year] [id number]  Ortho Devices Type of Ortho Device: Ace wrap, Arm sling, Sugartong splint Ortho Device/Splint Location: RUE Ortho Device/Splint Interventions: Ordered, Application   Braulio Bosch 01/25/2016, 5:30 PM

## 2016-01-25 NOTE — ED Provider Notes (Signed)
CSN: [id number]     Arrival date & time 01/25/16  1546 History   First MD Initiated Contact with Patient 01/25/16 1610     Chief Complaint  Patient presents with  . Fall  . Arm Injury     (Consider location/radiation/quality/duration/timing/severity/associated sxs/prior Treatment) HPI Patient has history of Parkinson's disease and unsteady gait. States she tripped on a throw rug and landed on her right arm. Denies hitting her head or neck. No loss of consciousness. Complains of pain to the right wrist. No deformity or swelling. Denies numbness or weakness. She was in her normal state prior to the fall which occurred roughly 5 hours ago. Past Medical History  Diagnosis Date  . Depression   . Anxiety   . Migraines   . Parkinson's disease ([place])   . Hypothyroidism   . Acid reflux disease   . Unspecified vitamin D deficiency 11/07/2012  . Anemia   . Hyperlipidemia   . Osteoporosis   . Insomnia   . IBS (irritable bowel syndrome)   . Chronic obstructive airway disease [facility])    Past Surgical History  Procedure Laterality Date  . Bladder  09/2013    bladder tac  . Abdominal hysterectomy    . Left arm surgery  2004   Family History  Problem Relation Age of Onset  . Alzheimer's disease Mother   . Cancer Father   . Colon cancer Neg Hx    Social History  Substance Use Topics  . Smoking status: Never Smoker   . Smokeless tobacco: Never Used  . Alcohol Use: No   OB History    No data available     Review of Systems  Constitutional: Negative for fever and chills.  Respiratory: Negative for shortness of breath.   Cardiovascular: Negative for chest pain.  Gastrointestinal: Negative for nausea, vomiting, abdominal pain and diarrhea.  Musculoskeletal: Positive for arthralgias. Negative for back pain and neck pain.  Skin: Negative for rash and wound.  Neurological: Negative for dizziness, syncope, weakness, light-headedness, numbness and headaches.  All other systems reviewed  and are negative.     Allergies  Ciprofloxacin; Cymbalta; Nitrofurantoin macrocrystal; Sulfa antibiotics; Codeine; Macrobid; Mobic; and Phenergan  Home Medications   Prior to Admission medications   Medication Sig Start Date End Date Taking? Authorizing Provider  ALPRAZolam Duanne Moron) 0.5 MG tablet Take 0.5 mg by mouth 3 (three) times daily as needed for sleep.    Historical Provider, MD  butalbital-acetaminophen-caffeine (FIORICET, ESGIC) 50-325-40 MG per tablet Take 1 tablet by mouth 2 (two) times daily as needed for headache.    Historical Provider, MD  carbidopa-levodopa (SINEMET IR) 25-100 MG tablet Take 1 tablet by mouth 3 (three) times daily. 11/07/15   Star Age, MD  denosumab (PROLIA) 60 MG/ML SOLN injection Inject 60 mg into the skin every 6 (six) months. Administer in upper arm, thigh, or abdomen    Historical Provider, MD  dicyclomine (BENTYL) 10 MG capsule Take 1 capsule (10 mg total) by mouth 4 (four) times daily -  before meals and at bedtime. 11/07/15   Ladene Artist, MD  FLUoxetine (PROZAC) 20 MG capsule TK ONE C PO  QHS 06/14/15   Historical Provider, MD  HYDROcodone-acetaminophen (NORCO) 5-325 MG tablet Take 1 tablet by mouth every 6 (six) hours as needed for severe pain. 01/25/16   Julianne Rice, MD  Iron-FA-B Cmp-C-Biot-Probiotic (FUSION PLUS) CAPS  02/09/15   Historical Provider, MD  levothyroxine (SYNTHROID, LEVOTHROID) 75 MCG tablet Take 75 mcg by mouth  daily.    Historical Provider, MD  omeprazole (PRILOSEC) 40 MG capsule Take 1 capsule (40 mg total) by mouth 2 (two) times daily. 11/07/15   Ladene Artist, MD  oxybutynin (DITROPAN) 5 MG tablet Take 5 mg by mouth as directed.    Historical Provider, MD  pramipexole (MIRAPEX) 1.5 MG tablet Take 1/2 pill 3 times a day: at 9am, 1 pm and 8pm 11/07/15   Star Age, MD  pramipexole (MIRAPEX) 1.5 MG tablet TAKE ONE-HALF TABLET BY MOUTH 3 TIMES A DAY, 9 AM, 1 PM, AND 8 PM 01/02/16   Star Age, MD   BP 122/73 mmHg  Pulse 78   Temp(Src) 98.4 F (36.9 C) (Oral)  Resp 18  Ht 4\' 11"  (1.499 m)  Wt 95 lb (43.092 kg)  BMI 19.18 kg/m2  SpO2 97% Physical Exam  Constitutional: She is oriented to person, place, and time. She appears well-developed and well-nourished. No distress.  HENT:  Head: Normocephalic and atraumatic.  Mouth/Throat: Oropharynx is clear and moist.  Eyes: EOM are normal. Pupils are equal, round, and reactive to light.  Neck: Normal range of motion. Neck supple.  No posterior midline cervical tenderness to palpation.  Cardiovascular: Normal rate and regular rhythm.  Exam reveals no gallop and no friction rub.   No murmur heard. Pulmonary/Chest: Effort normal and breath sounds normal. No respiratory distress. She has no wheezes. She has no rales. She exhibits no tenderness.  Abdominal: Soft. Bowel sounds are normal. She exhibits no distension and no mass. There is no tenderness. There is no rebound and no guarding.  Musculoskeletal: Normal range of motion. She exhibits tenderness. She exhibits no edema.  Tenderness to palpation along the distal radius. There is no deformity. No snuffbox or tenderness of the right hand. Full range of motion of the right elbow without tenderness or swelling. Distal pulses and cap refill are intact.  Neurological: She is alert and oriented to person, place, and time.  Tremor noted. 5/5 motor in all extremities. Sensation fully intact.  Skin: Skin is warm and dry. No rash noted. No erythema.  Psychiatric: She has a normal mood and affect. Her behavior is normal.  Nursing note and vitals reviewed.   ED Course  Procedures (including critical care time) Labs Review Labs Reviewed - No data to display  Imaging Review Dg Forearm Right  01/25/2016  CLINICAL DATA:  Right forearm pain following a fall today. EXAM: RIGHT FOREARM - 2 VIEW COMPARISON:  Right wrist and hand radiographs obtained at the same time. FINDINGS: Previously described nondisplaced fracture of the distal  radial metaphysis. No angulation. No other fractures are seen. IMPRESSION: Nondisplaced distal radial metaphysis fracture. Electronically Signed   By: Claudie Revering M.D.   On: 01/25/2016 16:51   Dg Wrist Complete Right  01/25/2016  CLINICAL DATA:  Right wrist pain following a fall today. EXAM: RIGHT WRIST - COMPLETE 3+ VIEW COMPARISON:  Right hand and forearm obtained at the same time. FINDINGS: Transverse fracture of the distal radial metaphysis without displacement or angulation. No other fractures are seen. First IP joint degenerative changes. IMPRESSION: Nondisplaced fracture of the distal radial metaphysis. Electronically Signed   By: Claudie Revering M.D.   On: 01/25/2016 16:48   Dg Hand Complete Right  01/25/2016  CLINICAL DATA:  Right hand pain following a fall today. EXAM: RIGHT HAND - COMPLETE 3+ VIEW COMPARISON:  Right wrist and forearm radiographs obtained at the same time. FINDINGS: The previously demonstrated nondisplaced right transverse fracture of  the distal radial metaphysis is faintly visualized. No additional fractures and no dislocation. First IP joint and second, third and fourth DIP joint degenerative changes. IMPRESSION: 1. Nondisplaced distal radial metaphysis fracture. 2. Changes of osteoarthritis involving the first through fourth digits. Electronically Signed   By: Claudie Revering M.D.   On: 01/25/2016 16:50   I have personally reviewed and evaluated these images and lab results as part of my medical decision-making.   EKG Interpretation None      MDM   Final diagnoses:  Distal radius fracture, right, closed, initial encounter     Patient with nondisplaced right distal radius fracture. Placed in a short arm sugar tong splint and sling. Will give her orthopedic follow-up.   Julianne Rice, MD 01/26/16 1309

## 2016-01-25 NOTE — ED Notes (Signed)
Patient transported to X-ray 

## 2016-01-25 NOTE — Discharge Instructions (Signed)
Forearm Fracture A forearm fracture is a break in one or both of the bones of your arm that are between the elbow and the wrist. Your forearm is made up of two bones:  Radius. This is the bone on the inside of your arm near your thumb.  Ulna. This is the bone on the outside of your arm near your little finger. Middle forearm fractures usually break both the radius and the ulna. Most forearm fractures that involve both the ulna and radius will require surgery. CAUSES Common causes of this type of fracture include:  Falling on an outstretched arm.  Accidents, such as a car or bike accident.  A hard, direct hit to the middle part of your arm. RISK FACTORS You may be at higher risk for this type of fracture if:  You play contact sports.  You have a condition that causes your bones to be weak or thin (osteoporosis). SIGNS AND SYMPTOMS A forearm fracture causes pain immediately after the injury. Other signs and symptoms include:  An abnormal bend or bump in your arm (deformity).  Swelling.  Numbness or tingling.  Tenderness.  Inability to turn your hand from side to side (rotate).  Bruising. DIAGNOSIS Your health care provider may diagnose a forearm fracture based on:  Your symptoms.  Your medical history, including any recent injury.  A physical exam. Your health care provider will look for any deformity and feel for tenderness over the break. Your health care provider will also check whether the bones are out of place.  An X-ray exam to confirm the diagnosis and learn more about the type of fracture. TREATMENT The goals of treatment are to get the bone or bones in proper position for healing and to keep the bones from moving so they will heal over time. Your treatment will depend on many factors, especially the type of fracture that you have.  If the fractured bone or bones:  Are in the correct position (nondisplaced), you may only need to wear a cast or a  splint.  Have a slightly displaced fracture, you may need to have the bones moved back into place manually (closed reduction) before the splint or cast is put on.  You may have a temporary splint before you have a cast. The splint allows room for some swelling. After a few days, a cast can replace the splint.  You may have to wear the cast for 6-8 weeks or as directed by your health care provider.  The cast may be changed after about 3 weeks or as directed by your health care provider.  After your cast is removed, you may need physical therapy to regain full movement in your wrist or elbow.  You may need emergency surgery if you have:  A fractured bone or bones that are out of position (displaced).  A fracture with multiple fragments (comminuted fracture).  A fracture that breaks the skin (open fracture). This type of fracture may require surgical wires, plates, or screws to hold the bone or bones in place.  You may have X-rays every couple of weeks to check on your healing. HOME CARE INSTRUCTIONS If You Have a Cast:  Do not stick anything inside the cast to scratch your skin. Doing that increases your risk of infection.  Check the skin around the cast every day. Report any concerns to your health care provider. You may put lotion on dry skin around the edges of the cast. Do not apply lotion to the skin  underneath the cast. If You Have a Splint:  Wear it as directed by your health care provider. Remove it only as directed by your health care provider.  Loosen the splint if your fingers become numb and tingle, or if they turn cold and blue. Bathing  Cover the cast or splint with a watertight plastic bag to protect it from water while you bathe or shower. Do not let the cast or splint get wet. Managing Pain, Stiffness, and Swelling  If directed, apply ice to the injured area:  Put ice in a plastic bag.  Place a towel between your skin and the bag.  Leave the ice on for 20  minutes, 2-3 times a day.  Move your fingers often to avoid stiffness and to lessen swelling.  Raise the injured area above the level of your heart while you are sitting or lying down. Driving  Do not drive or operate heavy machinery while taking pain medicine.  Do not drive while wearing a cast or splint on a hand that you use for driving. Activity  Return to your normal activities as directed by your health care provider. Ask your health care provider what activities are safe for you.  Perform range-of-motion exercises only as directed by your health care provider. Safety  Do not use your injured limb to support your body weight until your health care provider says that you can. General Instructions  Do not put pressure on any part of the cast or splint until it is fully hardened. This may take several hours.  Keep the cast or splint clean and dry.  Do not use any tobacco products, including cigarettes, chewing tobacco, or electronic cigarettes. Tobacco can delay bone healing. If you need help quitting, ask your health care provider.  Take medicines only as directed by your health care provider.  Keep all follow-up visits as directed by your health care provider. This is important. SEEK MEDICAL CARE IF:  Your pain medicine is not helping.  Your cast or splint becomes wet or damaged or suddenly feels too tight.  Your cast becomes loose.  You have more severe pain or swelling than you did before the cast.  You have severe pain when you stretch your fingers.  You continue to have pain or stiffness in your elbow or your wrist after your cast is removed. SEEK IMMEDIATE MEDICAL CARE IF:  You cannot move your fingers.  You lose feeling in your fingers or your hand.  Your hand or your fingers turn cold and pale or blue.  You notice a bad smell coming from your cast.  You have drainage from underneath your cast.  You have new stains from blood or drainage that is coming  through your cast.   This information is not intended to replace advice given to you by your health care provider. Make sure you discuss any questions you have with your health care provider.   Document Released: 07/31/2000 Document Revised: 08/24/2014 Document Reviewed: 03/19/2014 Elsevier Interactive Patient Education [employer].

## 2016-01-25 NOTE — ED Notes (Signed)
Family at bedside. 

## 2016-01-27 DIAGNOSIS — S52561A Barton's fracture of right radius, initial encounter for closed fracture: Secondary | ICD-10-CM | POA: Diagnosis not present

## 2016-02-06 DIAGNOSIS — S52561D Barton's fracture of right radius, subsequent encounter for closed fracture with routine healing: Secondary | ICD-10-CM | POA: Diagnosis not present

## 2016-02-17 DIAGNOSIS — M25531 Pain in right wrist: Secondary | ICD-10-CM | POA: Diagnosis not present

## 2016-02-24 DIAGNOSIS — M25531 Pain in right wrist: Secondary | ICD-10-CM | POA: Diagnosis not present

## 2016-03-07 DIAGNOSIS — M25531 Pain in right wrist: Secondary | ICD-10-CM | POA: Diagnosis not present

## 2016-03-16 DIAGNOSIS — M25531 Pain in right wrist: Secondary | ICD-10-CM | POA: Diagnosis not present

## 2016-03-16 DIAGNOSIS — R42 Dizziness and giddiness: Secondary | ICD-10-CM | POA: Diagnosis not present

## 2016-03-24 DIAGNOSIS — Z6820 Body mass index (BMI) 20.0-20.9, adult: Secondary | ICD-10-CM | POA: Diagnosis not present

## 2016-03-24 DIAGNOSIS — K219 Gastro-esophageal reflux disease without esophagitis: Secondary | ICD-10-CM | POA: Diagnosis not present

## 2016-03-24 DIAGNOSIS — R42 Dizziness and giddiness: Secondary | ICD-10-CM | POA: Diagnosis not present

## 2016-03-24 DIAGNOSIS — E785 Hyperlipidemia, unspecified: Secondary | ICD-10-CM | POA: Diagnosis not present

## 2016-03-24 DIAGNOSIS — D539 Nutritional anemia, unspecified: Secondary | ICD-10-CM | POA: Diagnosis not present

## 2016-04-02 DIAGNOSIS — M810 Age-related osteoporosis without current pathological fracture: Secondary | ICD-10-CM | POA: Diagnosis not present

## 2016-04-05 ENCOUNTER — Other Ambulatory Visit: Payer: Self-pay | Admitting: Neurology

## 2016-04-05 DIAGNOSIS — G249 Dystonia, unspecified: Secondary | ICD-10-CM

## 2016-04-05 DIAGNOSIS — F329 Major depressive disorder, single episode, unspecified: Secondary | ICD-10-CM

## 2016-04-05 DIAGNOSIS — G20 Parkinson's disease: Secondary | ICD-10-CM

## 2016-04-05 DIAGNOSIS — F32A Depression, unspecified: Secondary | ICD-10-CM

## 2016-04-06 DIAGNOSIS — R42 Dizziness and giddiness: Secondary | ICD-10-CM | POA: Diagnosis not present

## 2016-04-06 DIAGNOSIS — R262 Difficulty in walking, not elsewhere classified: Secondary | ICD-10-CM | POA: Diagnosis not present

## 2016-04-07 DIAGNOSIS — M25531 Pain in right wrist: Secondary | ICD-10-CM | POA: Diagnosis not present

## 2016-04-10 DIAGNOSIS — R42 Dizziness and giddiness: Secondary | ICD-10-CM | POA: Diagnosis not present

## 2016-04-10 DIAGNOSIS — R262 Difficulty in walking, not elsewhere classified: Secondary | ICD-10-CM | POA: Diagnosis not present

## 2016-04-14 DIAGNOSIS — R262 Difficulty in walking, not elsewhere classified: Secondary | ICD-10-CM | POA: Diagnosis not present

## 2016-04-14 DIAGNOSIS — R42 Dizziness and giddiness: Secondary | ICD-10-CM | POA: Diagnosis not present

## 2016-05-05 DIAGNOSIS — D539 Nutritional anemia, unspecified: Secondary | ICD-10-CM | POA: Diagnosis not present

## 2016-05-05 DIAGNOSIS — Z9181 History of falling: Secondary | ICD-10-CM | POA: Diagnosis not present

## 2016-05-05 DIAGNOSIS — S098XXA Other specified injuries of head, initial encounter: Secondary | ICD-10-CM | POA: Diagnosis not present

## 2016-05-07 DIAGNOSIS — S0990XA Unspecified injury of head, initial encounter: Secondary | ICD-10-CM | POA: Diagnosis not present

## 2016-05-07 DIAGNOSIS — S098XXA Other specified injuries of head, initial encounter: Secondary | ICD-10-CM | POA: Diagnosis not present

## 2016-05-12 DIAGNOSIS — D539 Nutritional anemia, unspecified: Secondary | ICD-10-CM | POA: Diagnosis not present

## 2016-05-12 DIAGNOSIS — I959 Hypotension, unspecified: Secondary | ICD-10-CM | POA: Diagnosis not present

## 2016-05-12 DIAGNOSIS — R262 Difficulty in walking, not elsewhere classified: Secondary | ICD-10-CM | POA: Diagnosis not present

## 2016-05-22 DIAGNOSIS — S52531A Colles' fracture of right radius, initial encounter for closed fracture: Secondary | ICD-10-CM | POA: Diagnosis not present

## 2016-05-22 DIAGNOSIS — G5601 Carpal tunnel syndrome, right upper limb: Secondary | ICD-10-CM | POA: Diagnosis not present

## 2016-05-26 DIAGNOSIS — I959 Hypotension, unspecified: Secondary | ICD-10-CM | POA: Diagnosis not present

## 2016-06-16 DIAGNOSIS — R262 Difficulty in walking, not elsewhere classified: Secondary | ICD-10-CM | POA: Diagnosis not present

## 2016-06-16 DIAGNOSIS — I959 Hypotension, unspecified: Secondary | ICD-10-CM | POA: Diagnosis not present

## 2016-06-18 ENCOUNTER — Encounter: Payer: Self-pay | Admitting: Nurse Practitioner

## 2016-06-18 ENCOUNTER — Ambulatory Visit (INDEPENDENT_AMBULATORY_CARE_PROVIDER_SITE_OTHER): Payer: Medicare Other | Admitting: Nurse Practitioner

## 2016-06-18 VITALS — BP 129/69 | HR 66 | Ht <= 58 in | Wt 96.4 lb

## 2016-06-18 DIAGNOSIS — F32A Depression, unspecified: Secondary | ICD-10-CM

## 2016-06-18 DIAGNOSIS — F329 Major depressive disorder, single episode, unspecified: Secondary | ICD-10-CM | POA: Diagnosis not present

## 2016-06-18 DIAGNOSIS — G20 Parkinson's disease: Secondary | ICD-10-CM

## 2016-06-18 DIAGNOSIS — R413 Other amnesia: Secondary | ICD-10-CM | POA: Diagnosis not present

## 2016-06-18 DIAGNOSIS — G20A1 Parkinson's disease without dyskinesia, without mention of fluctuations: Secondary | ICD-10-CM

## 2016-06-18 NOTE — Patient Instructions (Signed)
Continue Mirapex at current dose does not need refills Continue Sinemet at current dose, does not need refills Memory score is stable will follow over time Given suggestions for exercises for the memory such as cross words ,strategy games etc. Encouraged exercise by walking on a daily basis at least 30 minutes, stay well hydrated For her depression she is to continue her Prozac 20 mg daily Follow-up in 6 months Next with Dr. Rexene Alberts

## 2016-06-18 NOTE — Progress Notes (Addendum)
[facility]  PATIENT: Judy Holt DOB: [birth year]   REASON FOR VISIT: Follow-up for Parkinson's disease, memory loss HISTORY FROM: Patient and daughter    HISTORY OF PRESENT ILLNESS:HISTORY:Judy Holt is a very pleasant 80 year old right-handed woman with an underlying medical history of depression, anxiety, migraine headaches, hypothyroidism, reflux disease, hyperlipidemia, and vitamin D deficiency, status post left wrist surgery after a fall resulting in left wrist fracture, s/p cataract repairs in 4/09, who presents for followup consultation of her right-sided predominant Parkinson's disease, complicated by mild intermittent dyskinesias. She is accompanied by her daughter, Judy Holt, her younger daughter today. I last saw her on 05/16/2014, at which time she reported feeling stable. She was sleeping well and felt she had stable memory. She was still driving. She had no hallucinations, she was taking Sinemet 3 times a day and half a pill of Mirapex 4 times a day. She reported no recent falls. I encouraged her to take Sinemet away from her mealtimes to ensure better absorption. I did not change her medication regimen otherwise.  Today, 12/03/2014: She is able to provide her own history. Her daughter provides additional information. She reports having issues with her stomach, ongoing. She has a Hx of hiatal hernia which was diagnosed 2 years ago. According to the patient and her daughter her hernia is large and she was not advised to go through with any surgical procedure. She has been on Prevacid every day. She takes Advil for stomach pain once or twice daily. She takes it every day. She has been prescribed tramadol but could not tolerate it and she had nausea and diarrhea with it. It did not help her pain. She sees a GI doctor in [place]. She is sleepy during the day according to her daughter. She may fall asleep for an extended period of time. She tries to drink water. Sinemet  makes her nauseated. She has mild dyskinesias and has had visual illusions but not frank hallucinations. She is primarily bothered by her stomach pain including cramping and she feels that there are knots sometimes she can feel in her abdomen. For anxiety she takes Xanax but primarily takes it at night to help her sleep. Very occasionally she will take a daytime dose. She has had more depressive symptoms and sometimes is frankly tearful. She has been on generic Lexapro 10 mg for years. UPDATE 06/19/15 Judy Holt 80 year old female returns for follow-up. She has a history of Parkinson's disease for approximately 12 years. She is currently on Sinemet and Mirapex, she recently had to come off of her medications for a couple of days to have vertigo testing. Her major manifestation of her Parkinson's disease is right hand and arm tremor and occasional right foot tremor. She has had intermittent dyskinesias. She denies any daytime sleepiness, any compulsive behaviors, any postural dizziness. She has not fallen. She does not use an assistive device. She gets no regular exercise She has a history of depression and new complaints of memory loss today but denies any hallucinations. She was on Lexapro when last seen and that has been changed to Prozac. She is independent in all activities of daily living and continues to drive a car without getting lost. She was recently diagnosed with vertigo and is due to start some rehabilitation service . She returns for reevaluation Update 05/02/2017CM Judy Holt 80 year old returns for follow-up. She has had Parkinson's disease for approximately 12.5 years. She is currently on Mirapex and Sinemet without side effects. She denies any daytime sleepiness  compulsive behaviors. She was out in the yard pulling weeds 3 weeks ago on a hot day and became dizzy and fell to felt to the ground no apparent injury. She was probably dehydrated. She does have a history of depression for which she  takes Prozac. She is independent in all activities of daily living she continues to drive her car without difficulty. She returns for follow-up UPDATE 06/18/16 CM Judy Holt, 80 year old female returns for follow-up. She has a history of Parkinson's disease for 13 years. She is currently on Sinemet and Mirapex without side effects in good control of her symptoms. She denies any compulsive behaviors. She does have a history of depression for which she takes Prozac. She continues to be independent in all activities of daily living and she continues to drive her car without any difficulty she returns today for follow-up with her daughter. She has no new complaints REVIEW OF SYSTEMS: Full 14 system review of systems performed and notable only for those listed, all others are neg:  Constitutional: Fatigue Cardiovascular: neg Ear/Nose/Throat: neg  Skin: neg Eyes: Light sensitivity Respiratory: neg Gastroitestinal: neg  Hematology/Lymphatic: neg  Endocrine: neg Musculoskeletal:neg Allergy/Immunology: neg Neurological: Memory loss,  Psychiatric: Depression Sleep : neg   ALLERGIES: Allergies  Allergen Reactions  . Ciprofloxacin   . Cymbalta [Duloxetine Hcl]   . Nitrofurantoin Macrocrystal   . Sulfa Antibiotics   . Codeine   . Macrobid [Nitrofurantoin]   . Mobic [Meloxicam] Itching and Swelling  . Phenergan [Promethazine]     HOME MEDICATIONS: Outpatient Medications Prior to Visit  Medication Sig Dispense Refill  . ALPRAZolam (XANAX) 0.5 MG tablet Take 0.5 mg by mouth 3 (three) times daily as needed for sleep.    . butalbital-acetaminophen-caffeine (FIORICET, ESGIC) 50-325-40 MG per tablet Take 1 tablet by mouth 2 (two) times daily as needed for headache.    . carbidopa-levodopa (SINEMET IR) 25-100 MG tablet TAKE 1 TABLET BY MOUTH THREE TIMES DAILY 270 tablet 3  . denosumab (PROLIA) 60 MG/ML SOLN injection Inject 60 mg into the skin every 6 (six) months. Administer in upper arm, thigh, or  abdomen    . dicyclomine (BENTYL) 10 MG capsule Take 1 capsule (10 mg total) by mouth 4 (four) times daily -  before meals and at bedtime. 270 capsule 1  . HYDROcodone-acetaminophen (NORCO) 5-325 MG tablet Take 1 tablet by mouth every 6 (six) hours as needed for severe pain. 10 tablet 0  . Iron-FA-B Cmp-C-Biot-Probiotic (FUSION PLUS) CAPS     . omeprazole (PRILOSEC) 40 MG capsule Take 1 capsule (40 mg total) by mouth 2 (two) times daily. 180 capsule 1  . oxybutynin (DITROPAN) 5 MG tablet Take 5 mg by mouth as directed.    . pramipexole (MIRAPEX) 1.5 MG tablet TAKE ONE-HALF TABLET BY MOUTH 3 TIMES A DAY, 9 AM, 1 PM, AND 8 PM 135 tablet 11  . carbidopa-levodopa (SINEMET IR) 25-100 MG tablet Take 1 tablet by mouth 3 (three) times daily. (Patient not taking: Reported on 06/18/2016) 270 tablet 3  . FLUoxetine (PROZAC) 20 MG capsule TK ONE C PO  QHS  3  . levothyroxine (SYNTHROID, LEVOTHROID) 75 MCG tablet Take 75 mcg by mouth daily.    . pramipexole (MIRAPEX) 1.5 MG tablet Take 1/2 pill 3 times a day: at 9am, 1 pm and 8pm (Patient not taking: Reported on 06/18/2016) 135 tablet 3   No facility-administered medications prior to visit.     PAST MEDICAL HISTORY: Past Medical History:  Diagnosis Date  .  Acid reflux disease   . Anemia   . Anxiety   . Chronic obstructive airway disease ([place])   . Depression   . Hyperlipidemia   . Hypothyroidism   . IBS (irritable bowel syndrome)   . Insomnia   . Migraines   . Osteoporosis   . Parkinson's disease ([place])   . Unspecified vitamin D deficiency 11/07/2012    PAST SURGICAL HISTORY: Past Surgical History:  Procedure Laterality Date  . ABDOMINAL HYSTERECTOMY    . bladder  09/2013   bladder tac  . left arm surgery  2004    FAMILY HISTORY: Family History  Problem Relation Age of Onset  . Alzheimer's disease Mother   . Stroke Mother   . Cancer Father   . Colon cancer Neg Hx     SOCIAL HISTORY: Social History   Social History  . Marital  status: Married    Spouse name: N/A  . Number of children: 2  . Years of education: 12+   Occupational History  . Retired    Social History Main Topics  . Smoking status: Never Smoker  . Smokeless tobacco: Never Used  . Alcohol use No  . Drug use: No  . Sexual activity: Not on file   Other Topics Concern  . Not on file   Social History Narrative   Patient is married with 2 children.   Patient is right handed.   Patient has high school education.   Patient drinks 4 cups daily.     PHYSICAL EXAM  Vitals:   06/18/16 1428  BP: 129/69  Pulse: 66  Weight: 96 lb 6.4 oz (43.7 kg)  Height: 4\' 10"  (1.473 m)   Body mass index is 20.15 kg/m. Generalized: Well developed, in no acute distress, minimal masking of the face  Head: normocephalic and atraumatic,. Oropharynx benign  Neck: Supple, no carotid bruits  Cardiac: Regular rate rhythm, no murmur  Musculoskeletal: No deformity   Neurological examination   Mentation: Alert oriented to time, place, history taking.MMSE 29/30. AFT 12. Clock drawing 3/4.  Attention span and concentration appropriate. Follows all commands speech mildly hypophonic and language fluent.   Cranial nerve II-XII: Pupils were equal round reactive to light extraocular movements were full except for mild  decreased upgaze. , visual field were full on confrontational test. Facial sensation and strength were normal. hearing was intact to finger rubbing bilaterally. Uvula tongue midline. head turning and shoulder shrug were normal and symmetric.Tongue protrusion into cheek strength was normal. Negative Myerson sign  Motor: normal bulk and tone, mild right hand resting tremor which is intermittent, fine finger movements are mildly impaired right more than left this includes finger taps and foot taps and hand movements . Mild tremor of right foot noted No focal weakness Sensory: normal and symmetric to light touch, pinprick, and Vibration, in the upper and  lower extremities Coordination: finger-nose-finger, heel-to-shin bilaterally, no dysmetria Reflexes: Brachioradialis 2/2, biceps 2/2, triceps 2/2, patellar 2/2, Achilles 2/2, plantar responses were flexor bilaterally. Gait and Station: Rising up from seated position without assistance, stooped posture ambulated 210 feet in the hall, no difficulty with turns, no decreased arm swing . Ambulated in 55 seconds  DIAGNOSTIC DATA (LABS, IMAGING, TESTING) - ASSESSMENT AND PLAN 80 y.o. year old female has a past medical history of Depression; idiopathic Parkinson's disease for 13 years, and mild memory loss.MMSE 29/30. AFT 12. Clock drawing 3/4. Marland Kitchen Parkinson symptoms seem to be in good control at present.   Continue Mirapex at current  dose does not need refills Continue Sinemet at current dose, does not need refills Memory score is stable will follow over time Given suggestions for exercises for the memory such as cross words ,strategy games etc. Encouraged exercise by walking on a daily basis at least 30 minutes, stay well hydrated For her depression she is to continue her Prozac 20 mg daily Follow-up in 6 months Dennie Bible, [facility], [facility], [facility] [address], [place], [place] [address] [phone]  I reviewed the above note and documentation by the Nurse Practitioner and agree with the history, physical exam, assessment and plan as outlined above. I was immediately available for face-to-face consultation.  Star Age, MD, PhD [facility])

## 2016-06-29 DIAGNOSIS — G5601 Carpal tunnel syndrome, right upper limb: Secondary | ICD-10-CM | POA: Diagnosis not present

## 2016-06-30 ENCOUNTER — Other Ambulatory Visit: Payer: Self-pay | Admitting: Gastroenterology

## 2016-06-30 DIAGNOSIS — E611 Iron deficiency: Secondary | ICD-10-CM

## 2016-06-30 DIAGNOSIS — K219 Gastro-esophageal reflux disease without esophagitis: Secondary | ICD-10-CM

## 2016-06-30 DIAGNOSIS — K589 Irritable bowel syndrome without diarrhea: Secondary | ICD-10-CM

## 2016-07-02 DIAGNOSIS — S52531D Colles' fracture of right radius, subsequent encounter for closed fracture with routine healing: Secondary | ICD-10-CM | POA: Diagnosis not present

## 2016-07-02 DIAGNOSIS — G5601 Carpal tunnel syndrome, right upper limb: Secondary | ICD-10-CM | POA: Diagnosis not present

## 2016-07-13 DIAGNOSIS — H40053 Ocular hypertension, bilateral: Secondary | ICD-10-CM | POA: Diagnosis not present

## 2016-07-16 DIAGNOSIS — G20 Parkinson's disease: Secondary | ICD-10-CM | POA: Diagnosis not present

## 2016-07-16 DIAGNOSIS — I959 Hypotension, unspecified: Secondary | ICD-10-CM | POA: Diagnosis not present

## 2016-07-21 ENCOUNTER — Other Ambulatory Visit: Payer: Self-pay | Admitting: Gastroenterology

## 2016-07-21 DIAGNOSIS — K219 Gastro-esophageal reflux disease without esophagitis: Secondary | ICD-10-CM

## 2016-07-21 DIAGNOSIS — K589 Irritable bowel syndrome without diarrhea: Secondary | ICD-10-CM

## 2016-07-21 DIAGNOSIS — E611 Iron deficiency: Secondary | ICD-10-CM

## 2016-07-22 ENCOUNTER — Telehealth: Payer: Self-pay | Admitting: Gastroenterology

## 2016-07-22 DIAGNOSIS — K219 Gastro-esophageal reflux disease without esophagitis: Secondary | ICD-10-CM

## 2016-07-22 DIAGNOSIS — E611 Iron deficiency: Secondary | ICD-10-CM

## 2016-07-22 MED ORDER — OMEPRAZOLE 40 MG PO CPDR: 40.0000 mg | DELAYED_RELEASE_CAPSULE | Freq: Two times a day (BID) | ORAL | 0 refills | Status: DC

## 2016-07-22 NOTE — Telephone Encounter (Signed)
Informed patient that we can send a refill to her local pharmacy until her scheduled appt since she is over due for her appt. Patient scheduled her appt for 09/08/16. Prescription sent to the pharmacy.

## 2016-07-30 ENCOUNTER — Other Ambulatory Visit: Payer: Self-pay | Admitting: Gastroenterology

## 2016-07-30 DIAGNOSIS — K219 Gastro-esophageal reflux disease without esophagitis: Secondary | ICD-10-CM

## 2016-07-30 DIAGNOSIS — G5601 Carpal tunnel syndrome, right upper limb: Secondary | ICD-10-CM | POA: Diagnosis not present

## 2016-07-30 DIAGNOSIS — E611 Iron deficiency: Secondary | ICD-10-CM

## 2016-07-30 DIAGNOSIS — K589 Irritable bowel syndrome without diarrhea: Secondary | ICD-10-CM

## 2016-09-08 ENCOUNTER — Ambulatory Visit: Payer: Medicare Other | Admitting: Gastroenterology

## 2016-09-15 DIAGNOSIS — G20 Parkinson's disease: Secondary | ICD-10-CM | POA: Diagnosis not present

## 2016-09-15 DIAGNOSIS — M810 Age-related osteoporosis without current pathological fracture: Secondary | ICD-10-CM | POA: Diagnosis not present

## 2016-09-15 DIAGNOSIS — D539 Nutritional anemia, unspecified: Secondary | ICD-10-CM | POA: Diagnosis not present

## 2016-09-15 DIAGNOSIS — Z2821 Immunization not carried out because of patient refusal: Secondary | ICD-10-CM | POA: Diagnosis not present

## 2016-09-30 ENCOUNTER — Other Ambulatory Visit: Payer: Self-pay | Admitting: Gastroenterology

## 2016-11-10 DIAGNOSIS — G20 Parkinson's disease: Secondary | ICD-10-CM | POA: Diagnosis not present

## 2016-11-10 DIAGNOSIS — R413 Other amnesia: Secondary | ICD-10-CM | POA: Diagnosis not present

## 2016-11-10 DIAGNOSIS — M810 Age-related osteoporosis without current pathological fracture: Secondary | ICD-10-CM | POA: Diagnosis not present

## 2016-11-10 DIAGNOSIS — I959 Hypotension, unspecified: Secondary | ICD-10-CM | POA: Diagnosis not present

## 2016-11-12 DIAGNOSIS — H40053 Ocular hypertension, bilateral: Secondary | ICD-10-CM | POA: Diagnosis not present

## 2016-11-17 DIAGNOSIS — M810 Age-related osteoporosis without current pathological fracture: Secondary | ICD-10-CM | POA: Diagnosis not present

## 2016-12-07 ENCOUNTER — Other Ambulatory Visit: Payer: Self-pay | Admitting: Gastroenterology

## 2016-12-07 ENCOUNTER — Other Ambulatory Visit: Payer: Self-pay | Admitting: Neurology

## 2016-12-07 DIAGNOSIS — K589 Irritable bowel syndrome without diarrhea: Secondary | ICD-10-CM

## 2016-12-07 DIAGNOSIS — F329 Major depressive disorder, single episode, unspecified: Secondary | ICD-10-CM

## 2016-12-07 DIAGNOSIS — K219 Gastro-esophageal reflux disease without esophagitis: Secondary | ICD-10-CM

## 2016-12-07 DIAGNOSIS — G249 Dystonia, unspecified: Secondary | ICD-10-CM

## 2016-12-07 DIAGNOSIS — F32A Depression, unspecified: Secondary | ICD-10-CM

## 2016-12-07 DIAGNOSIS — E611 Iron deficiency: Secondary | ICD-10-CM

## 2016-12-07 DIAGNOSIS — G20 Parkinson's disease: Secondary | ICD-10-CM

## 2016-12-08 DIAGNOSIS — M542 Cervicalgia: Secondary | ICD-10-CM | POA: Diagnosis not present

## 2016-12-08 DIAGNOSIS — Z139 Encounter for screening, unspecified: Secondary | ICD-10-CM | POA: Diagnosis not present

## 2016-12-08 DIAGNOSIS — M50823 Other cervical disc disorders at C6-C7 level: Secondary | ICD-10-CM | POA: Diagnosis not present

## 2016-12-08 DIAGNOSIS — M50822 Other cervical disc disorders at C5-C6 level: Secondary | ICD-10-CM | POA: Diagnosis not present

## 2016-12-08 DIAGNOSIS — S199XXA Unspecified injury of neck, initial encounter: Secondary | ICD-10-CM | POA: Diagnosis not present

## 2016-12-08 DIAGNOSIS — M2578 Osteophyte, vertebrae: Secondary | ICD-10-CM | POA: Diagnosis not present

## 2016-12-08 DIAGNOSIS — Z1389 Encounter for screening for other disorder: Secondary | ICD-10-CM | POA: Diagnosis not present

## 2016-12-08 DIAGNOSIS — M50821 Other cervical disc disorders at C4-C5 level: Secondary | ICD-10-CM | POA: Diagnosis not present

## 2016-12-17 ENCOUNTER — Telehealth: Payer: Self-pay | Admitting: Gastroenterology

## 2016-12-17 ENCOUNTER — Other Ambulatory Visit: Payer: Self-pay | Admitting: Gastroenterology

## 2016-12-17 DIAGNOSIS — K219 Gastro-esophageal reflux disease without esophagitis: Secondary | ICD-10-CM

## 2016-12-17 DIAGNOSIS — K589 Irritable bowel syndrome without diarrhea: Secondary | ICD-10-CM

## 2016-12-17 DIAGNOSIS — E611 Iron deficiency: Secondary | ICD-10-CM

## 2016-12-17 NOTE — Telephone Encounter (Signed)
Patient no showed her appt on 09/08/16 and is still over due for her follow visit. Patient scheduled appt for 01/27/17. Can we give refills until her appt?

## 2016-12-18 MED ORDER — DICYCLOMINE HCL 10 MG PO CAPS: 10.0000 mg | ORAL_CAPSULE | Freq: Three times a day (TID) | ORAL | 0 refills | Status: DC

## 2016-12-18 NOTE — Telephone Encounter (Signed)
Prescription sent to patient's local pharmacy until scheduled appt. Patient verbalized understanding.

## 2016-12-18 NOTE — Telephone Encounter (Signed)
OK to refill until June appt

## 2016-12-21 ENCOUNTER — Encounter (INDEPENDENT_AMBULATORY_CARE_PROVIDER_SITE_OTHER): Payer: Self-pay

## 2016-12-21 ENCOUNTER — Encounter: Payer: Self-pay | Admitting: Neurology

## 2016-12-21 ENCOUNTER — Ambulatory Visit (INDEPENDENT_AMBULATORY_CARE_PROVIDER_SITE_OTHER): Payer: Medicare Other | Admitting: Neurology

## 2016-12-21 VITALS — BP 122/58 | HR 74 | Resp 12 | Ht <= 58 in | Wt 100.0 lb

## 2016-12-21 DIAGNOSIS — Z9181 History of falling: Secondary | ICD-10-CM | POA: Diagnosis not present

## 2016-12-21 DIAGNOSIS — R441 Visual hallucinations: Secondary | ICD-10-CM

## 2016-12-21 DIAGNOSIS — G249 Dystonia, unspecified: Secondary | ICD-10-CM

## 2016-12-21 DIAGNOSIS — G20 Parkinson's disease: Secondary | ICD-10-CM

## 2016-12-21 MED ORDER — CARBIDOPA-LEVODOPA 25-100 MG PO TABS: 1.0000 | ORAL_TABLET | Freq: Three times a day (TID) | ORAL | 3 refills | Status: DC

## 2016-12-21 MED ORDER — PRAMIPEXOLE DIHYDROCHLORIDE 1.5 MG PO TABS: ORAL_TABLET | ORAL | 3 refills | Status: DC

## 2016-12-21 NOTE — Progress Notes (Signed)
Subjective:    Patient ID: Judy Holt is a 81 y.o. female.  HPI     Interim history:   Ms. Judy Holt is a very pleasant 81 year old right-handed woman with an underlying medical history of depression, anxiety, migraine headaches, hypothyroidism, reflux disease, hyperlipidemia, and vitamin D deficiency, status post left wrist surgery after a fall resulting in left wrist fracture, s/p cataract repairs in 4/09, who presents for followup consultation of her right-sided predominant Parkinson's disease, complicated by mild intermittent dyskinesias, memory loss and constipation, as well as visual hallucinations and recent falls. She is accompanied by her daughter, Judy Holt, her younger daughter today. I last saw her on 12/03/2014, at which time she reported issues with her stomach, particularly with a history of hiatal hernia. She was taking Prevacid daily. She was taking Advil every day as well. She was prescribed tramadol but could not tolerate it secondary to nausea and diarrhea. She was following with GI in [place]. She was more sleepy during the day. She had no frank hallucinations. Sinemet made her nauseated. She was on Xanax for anxiety. She was on Lexapro 10 mg for this as well as a history of depression. I suggested we reduce her Mirapex to half a pill 3 times a day from 4 times a day.  She was seen in the interim by Cecille Rubin on 06/19/2015, at which time her MMSE was 27 out of 30, she was advised to continue with Mirapex and Sinemet. She was then seen by Cecille Rubin on 12/17/2015, at which time she was deemed stable of her current conditions. She was seen by Cecille Rubin on 06/18/2016, at which time she was doing fairly well on her current medication regimen and was advised to follow-up in 6 months.  Today, 12/21/2016 (all dictated new, as well as above notes, some dictation done in note pad or Word, outside of chart, may appear as copied):   She reports a recent fall some 6 months ago,  thankfully no major injuries. Fell in the yard, backwards. Police happened to come by and saw her, helped her up. She had a recent fall in the bedroom, bumped her head. Her PCP order Xray. She has had VH, not daily, seeing a cat, sometimes, not daily, not frightening, not very bothersome. She does not like to use the cane. She has a walker from her mother which she has not been using. She has had more problems with her hiatal hernia, including stomach pain, nausea, reflux. She also has intermittent constipation. She utilizes Benefiber in her coffee every morning. Her weight has improved a little bit. She had gone down to 93 pounds, after that he has gained a little bit of weight, currently around 100 pounds. She has a follow-up with her GI specialist soon.  The patient's allergies, current medications, family history, past medical history, past social history, past surgical history and problem list were reviewed and updated as appropriate.   Previously (copied from previous notes for reference):   I saw her on 05/16/2014, at which time she reported feeling stable. She was sleeping well and felt she had stable memory. She was still driving. She had no hallucinations, she was taking Sinemet 3 times a day and half a pill of Mirapex 4 times a day. She reported no recent falls. I encouraged her to take Sinemet away from her mealtimes to ensure better absorption. I did not change her medication regimen otherwise.  I saw her on 11/13/2013 at which time I encouraged her to  exercise regularly and take her levodopa away from her mealtimes for better absorption. She reported that she had bladder suspension surgery at [facility] around 09/26/13. She stayed overnight for one night. She reported no recent falls. She was sleeping well. She was taking Xanax 0.5 mg each night, occasionally in the day for anxiety and stress. Her migraines were stable. She and her husband both felt that she was quite stable.   I saw  her on 05/10/2013, at which time and felt she was for the most part stable and I did not suggest to change her medications. I did encourage her to exercise regularly.   I first met her on 11/07/2012 at which time I continued her on the same medications and encouraged her to exercise regularly.   She previously followed with Dr. Erling [name] for the past several years since her Dx about 10 years ago, and was last seen by him on 06/15/2012 at which time he felt she was doing well. He had noticed mild dyskinesias of her right foot. He encouraged her to exercise. She is maintained on a combination of carbidopa-levodopa 25/100 mg strength one pill at 9 AM, 1 tablet 11 AM, and half a pill at 4 PM and pramipexole 1.5 mg tablets half a tablet 4 times a day, namely at 9, 11, 4 PM and 8 PM. She stopped Lovastatin, and stated that her lipids were perfect. She has not had any falls, but has occasional balance issues. She fell when she was still working and broke her L wrist and required emergent surgery. This was in the 90s. She takes oxybutynin as needed for bladder urgency. She fell 2 years ago in the kitchen, but did not hit her head.   Her symptoms began on the right side with hand and arm tremor. She has had side effects from medications including nausea. Because of her nausea with Sinemet, Mirapex was added. She has not had her medicine changed in several years. Her major manifestation of Parkinson's disease is R hand and foot tremor. She denies daytime sleepiness, compulsive behavior, postural dizziness or swelling in the legs. She has depression and memory loss, and denies hallucinations. She takes a nap for 30 minutes after lunch 3 times per week. She is independent in her ADLs and drives a car. She is not in an exercise program. She does not use a cane or a walker. Her falls assessment tool score was 9. She says her memory is not as good as it has been. She was on sertraline for her depression and also tried cymbalta in  the past. She is on Lexapro 10 mg for her depression and for her anxiety she is on Xanax 0.5 mg qHS. She can take up to 2 a day. Her PCP is Laverna Peace, NP at [facility].    Her Past Medical History Is Significant For: Past Medical History:  Diagnosis Date  . Acid reflux disease   . Adenomatous colon polyp 09/2000  . Anemia   . Anxiety   . Chronic obstructive airway disease ([place])   . Depression   . Hiatal hernia   . Hyperlipidemia   . Hypothyroidism   . IBS (irritable bowel syndrome)   . Insomnia   . Migraines   . Osteoporosis   . Parkinson's disease ([place])   . Unspecified vitamin D deficiency 11/07/2012    Her Past Surgical History Is Significant For: Past Surgical History:  Procedure Laterality Date  . ABDOMINAL HYSTERECTOMY    . bladder  09/2013   bladder tac  . left arm surgery  2004    Her Family History Is Significant For: Family History  Problem Relation Age of Onset  . Alzheimer's disease Mother   . Stroke Mother   . Cancer Father   . Colon cancer Neg Hx     Her Social History Is Significant For: Social History   Social History  . Marital status: Married    Spouse name: N/A  . Number of children: 2  . Years of education: 12+   Occupational History  . Retired    Social History Main Topics  . Smoking status: Never Smoker  . Smokeless tobacco: Never Used  . Alcohol use No  . Drug use: No  . Sexual activity: Not Asked   Other Topics Concern  . None   Social History Narrative   Patient is married with 2 children.   Patient is right handed.   Patient has high school education.   Patient drinks 4 cups daily.    Her Allergies Are:  Allergies  Allergen Reactions  . Ciprofloxacin   . Cymbalta [Duloxetine Hcl]   . Nitrofurantoin Macrocrystal   . Sulfa Antibiotics   . Codeine   . Macrobid [Nitrofurantoin]   . Mobic [Meloxicam] Itching and Swelling  . Phenergan [Promethazine]   :   Her Current Medications Are:  Outpatient Encounter Prescriptions  as of 12/21/2016  Medication Sig  . ALPRAZolam (XANAX) 0.5 MG tablet Take 0.5 mg by mouth 3 (three) times daily as needed for sleep.  . butalbital-acetaminophen-caffeine (FIORICET, ESGIC) 50-325-40 MG per tablet Take 1 tablet by mouth 2 (two) times daily as needed for headache.  . carbidopa-levodopa (SINEMET IR) 25-100 MG tablet TAKE 1 TABLET BY MOUTH THREE TIMES DAILY  . carbidopa-levodopa (SINEMET IR) 25-100 MG tablet TAKE 1 TABLET BY MOUTH 3  TIMES DAILY  . denosumab (PROLIA) 60 MG/ML SOLN injection Inject 60 mg into the skin every 6 (six) months. Administer in upper arm, thigh, or abdomen  . dicyclomine (BENTYL) 10 MG capsule Take 1 capsule (10 mg total) by mouth 4 (four) times daily -  before meals and at bedtime.  Marland Kitchen FLUoxetine (PROZAC) 40 MG capsule   . fluticasone (FLONASE) 50 MCG/ACT nasal spray   . HYDROcodone-acetaminophen (NORCO) 5-325 MG tablet Take 1 tablet by mouth every 6 (six) hours as needed for severe pain.  . Iron-FA-B Cmp-C-Biot-Probiotic (FUSION PLUS) CAPS   . levothyroxine (SYNTHROID, LEVOTHROID) 100 MCG tablet   . mirtazapine (REMERON) 15 MG tablet   . omeprazole (PRILOSEC) 40 MG capsule Take 1 capsule (40 mg total) by mouth 2 (two) times daily.  Marland Kitchen omeprazole (PRILOSEC) 40 MG capsule TAKE 1 CAPSULE BY MOUTH TWO TIMES DAILY  . oxybutynin (DITROPAN) 5 MG tablet Take 5 mg by mouth as directed.  . pramipexole (MIRAPEX) 1.5 MG tablet TAKE ONE-HALF TABLET BY MOUTH 3 TIMES A DAY, 9 AM, 1 PM, AND 8 PM  . pramipexole (MIRAPEX) 1.5 MG tablet TAKE 1/2 TABLET BY MOUTH 3  TIMES DAILY (9AM, 1PM, 8PM)   No facility-administered encounter medications on file as of 12/21/2016.   :  Review of Systems:  Out of a complete 14 point review of systems, all are reviewed and negative with the exception of these symptoms as listed below: Review of Systems  Neurological:       No new concerns per patient. States that she fell recently but no injuries to report.     Objective:  Neurologic  Exam  Physical Exam Physical Examination:   Vitals:   12/21/16 1344  BP: (!) 122/58  Pulse: 74  Resp: 12   General Examination: The patient is a very pleasant 81 y.o. female in no acute distress. She appears well-developed and well-nourished and well groomed. She is in good spirits today.  HEENT: Normocephalic, atraumatic, pupils are equal, round and reactive to light and accommodation. Extraocular tracking shows mild saccadic breakdown and mild limitation to upgaze. She is status post bilateral cataract repairs. There is no nystagmus. Hearing is grossly intact. Face is symmetric with minimal facial masking noted. Speech is clear with no dysarthria noted. Speech is moderately hypophonic. There is a slight intermittent lower lip tremor. Neck is very mildly rigid with full range of motion. Oropharynx exam reveals normal findings except for mild mouth dryness. No significant airway crowding is noted. Mallampati is class II. Tongue protrudes centrally and palate elevates symmetrically.  Chest: is clear to auscultation without wheezing, rhonchi or crackles noted.  Heart: sounds are normal without murmurs, rubs or gallops noted.  Abdomen: is soft, non-tender and non-distended with normal bowel sounds appreciated on auscultation.   Extremities: There is no pitting edema in the distal lower extremities bilaterally. Pedal pulses are intact.  Skin: is warm and dry with no trophic changes noted. Mildly patchy dry skin is noted.  Musculoskeletal: exam reveals no obvious joint deformities, tenderness or joint swelling or erythema.  Neurologically:  Mental status: The patient is awake, alert and oriented to self and situation, place. Her memory, attention, language and knowledge are  mildly impaired.  There is no aphasia, agnosia, apraxia or anomia. Speech is clear with normal prosody and enunciation, but mild hypophonia noted. Thought process is linear. Mood is congruent. Affect is normal.    On 12/21/2016: MMSE: 27/30, CDT: 4/4, AFT: 11/min.  On 06/18/16: MMSE: 29/30, CDT: 3/4, AFT: 12/min.  On 12/17/15: MMSE: 26/30, CDT: 3/4, AFT: 7/min. On 06/19/15: MMSE: 27/30, CDT: 4/4, AFT: 11/min.  Cranial nerves are as described above under HEENT exam. Motor exam:  she has thin bulk, global strength of 4+ out of 5, she has mild generalized dyskinesias. Tone is mildly increased throughout. She has a mild intermittent right-sided of resting tremor.  Romberg is  not tested due to safety concern. Reflexes are 1+ throughout. Fine motor skills are mild to moderately impaired on the right, milder on the left.  Cerebellar testing shows no dysmetria or intention tremor. There is no truncal or gait ataxia.  Sensory exam is intact to light touch in the UEs and LEs. She stands up without problems and does not need any assistance. Posture is mild to moderately stooped, worse from my exam the past couple of years. She walks with fairly good pace but decreased stride length and decreased arm swing right more than left. She turns in 3 steps, balance is mildly impaired.   Assessment and Plan:    In summary, FARZANA KOCI is a very pleasant 81 year old female with an underlying medical history of depression, anxiety, migraine headaches, hypothyroidism, reflux disease, hyperlipidemia, and vitamin D deficiency, status post left wrist surgery after a fall resulting in left wrist fracture, s/p cataract repairs in 4/09, who presents for followup consultation of her right-sided predominant Parkinson's disease, complicated by mild intermittent dyskinesias, memory loss and constipation, as well as visual hallucinations and recent falls. She has had stable findings for quite some time, she has progressed in the past couple of years. Unfortunately, not everything responds to increases in  medication, she would be at risk for GI related side effects or low blood pressure or worsening hallucinations with increase in her  Parkinson's medication. To that end, I suggested we continue with her Sinemet 1 pill 3 times a day as well as pramipexole half a pill 3 times a day. She is advised to be very mindful about constipation and proactive with it. She is encouraged to increase her water intake. For gait safety, I suggested she start using her rolling walker, not necessarily inside the house but for outside use and longer distances. She has had ongoing issues with her hiatal hernia and GI related problems. She has an appointment with her GI doctor soon. She has had stable dyskinesias, develop recent visual hallucinations which we will monitor. These are not bothersome or frightening to her at this time and not daily. We reduced her pramipexole a couple of years ago secondary to concern for side effects including daytime somnolence, her dyskinesias and her occasional visual hallucinations. She has been on Lexapro in the past but for the past year or so she has been on Prozac with good results. We will monitor her memory scores as well which have been more or less stable in the past couple of years. I suggested a 6 month follow-up, she can see Cecille Rubin next time, I can see her back after that. I answered all their questions today and the patient and her daughter were in agreement.  I spent 25 minutes in total face-to-face time with the patient, more than 50% of which was spent in counseling and coordination of care, reviewing test results, reviewing medication and discussing or reviewing the diagnosis of PD, its prognosis and treatment options. Pertinent laboratory and imaging test results that were available during this visit with the patient were reviewed by me and considered in my medical decision making (see chart for details).

## 2016-12-21 NOTE — Patient Instructions (Signed)
We will continue with your parkinson's medication at the current doses.  Please consider using her rolling walker for gait safety. You have fallen a few times.

## 2017-01-27 ENCOUNTER — Ambulatory Visit (INDEPENDENT_AMBULATORY_CARE_PROVIDER_SITE_OTHER): Payer: Medicare Other | Admitting: Gastroenterology

## 2017-01-27 ENCOUNTER — Encounter (INDEPENDENT_AMBULATORY_CARE_PROVIDER_SITE_OTHER): Payer: Self-pay

## 2017-01-27 ENCOUNTER — Encounter: Payer: Self-pay | Admitting: Gastroenterology

## 2017-01-27 VITALS — BP 110/70 | HR 76 | Ht <= 58 in | Wt 94.6 lb

## 2017-01-27 DIAGNOSIS — K219 Gastro-esophageal reflux disease without esophagitis: Secondary | ICD-10-CM

## 2017-01-27 DIAGNOSIS — K588 Other irritable bowel syndrome: Secondary | ICD-10-CM | POA: Diagnosis not present

## 2017-01-27 MED ORDER — OMEPRAZOLE 40 MG PO CPDR: DELAYED_RELEASE_CAPSULE | ORAL | 3 refills | Status: DC

## 2017-01-27 NOTE — Progress Notes (Signed)
    History of Present Illness: This is a 81 year old female with GERD and IBS. She is accompanied by her daughter. She has Parkinson's disease on several medications followed by neurology. She has no gastrointestinal complaints and feels her GI symptoms are under good control.   Current Medications, Allergies, Past Medical History, Past Surgical History, Family History and Social History were reviewed in [employer] record.  Physical Exam: General: Well developed, well nourished, elderly, frail, no acute distress Head: Normocephalic and atraumatic Eyes:  sclerae anicteric, EOMI Ears: Normal auditory acuity Mouth: No deformity or lesions Lungs: Clear throughout to auscultation Heart: Regular rate and rhythm; no murmurs, rubs or bruits Abdomen: Soft, non tender and non distended. No masses, hepatosplenomegaly or hernias noted. Normal Bowel sounds Musculoskeletal: Symmetrical with no gross deformities  Pulses:  Normal pulses noted Extremities: No clubbing, cyanosis, edema or deformities noted Neurological: Alert oriented x 4, grossly nonfocal Psychological:  Alert and cooperative. Normal mood and affect  Assessment and Recommendations:  1. GERD. Continue omeprazole 40 mg twice daily and follow standard antireflux measures. REV in 1 year.  2. IBS. Continue dicyclomine 10 mg before meals and at bedtime as needed. Encouraged patient and her daughter to try to reduce the use of dicyclomine and discontinue it if she is no longer having active IBS symptoms. If this medication remains helpful for IBS symptoms she should remain on it.  I spent 15 minutes of face-to-face time with the patient. Greater than 50% of the time was spent counseling and coordinating care.

## 2017-01-27 NOTE — Patient Instructions (Signed)
We have sent the following prescriptions to your mail in pharmacy: omeprazole.   If you have not heard from your mail in pharmacy within 1 week or if you have not received your medication in the mail, please contact us at [phone] so we may find out why.  Thank you for choosing me and [facility].  Malcolm T. Stark, Jr., MD., FACG  

## 2017-02-08 ENCOUNTER — Other Ambulatory Visit: Payer: Self-pay | Admitting: Gastroenterology

## 2017-02-08 DIAGNOSIS — R413 Other amnesia: Secondary | ICD-10-CM | POA: Diagnosis not present

## 2017-02-08 DIAGNOSIS — I959 Hypotension, unspecified: Secondary | ICD-10-CM | POA: Diagnosis not present

## 2017-02-08 DIAGNOSIS — R1032 Left lower quadrant pain: Secondary | ICD-10-CM | POA: Diagnosis not present

## 2017-02-08 DIAGNOSIS — E611 Iron deficiency: Secondary | ICD-10-CM

## 2017-02-08 DIAGNOSIS — K589 Irritable bowel syndrome without diarrhea: Secondary | ICD-10-CM

## 2017-02-08 DIAGNOSIS — K219 Gastro-esophageal reflux disease without esophagitis: Secondary | ICD-10-CM

## 2017-05-10 DIAGNOSIS — H40053 Ocular hypertension, bilateral: Secondary | ICD-10-CM | POA: Diagnosis not present

## 2017-05-11 DIAGNOSIS — K219 Gastro-esophageal reflux disease without esophagitis: Secondary | ICD-10-CM | POA: Diagnosis not present

## 2017-05-11 DIAGNOSIS — Z139 Encounter for screening, unspecified: Secondary | ICD-10-CM | POA: Diagnosis not present

## 2017-05-11 DIAGNOSIS — E039 Hypothyroidism, unspecified: Secondary | ICD-10-CM | POA: Diagnosis not present

## 2017-05-11 DIAGNOSIS — M810 Age-related osteoporosis without current pathological fracture: Secondary | ICD-10-CM | POA: Diagnosis not present

## 2017-05-11 DIAGNOSIS — D539 Nutritional anemia, unspecified: Secondary | ICD-10-CM | POA: Diagnosis not present

## 2017-06-08 DIAGNOSIS — N959 Unspecified menopausal and perimenopausal disorder: Secondary | ICD-10-CM | POA: Diagnosis not present

## 2017-06-08 DIAGNOSIS — Z1389 Encounter for screening for other disorder: Secondary | ICD-10-CM | POA: Diagnosis not present

## 2017-06-08 DIAGNOSIS — Z0000 Encounter for general adult medical examination without abnormal findings: Secondary | ICD-10-CM | POA: Diagnosis not present

## 2017-06-08 DIAGNOSIS — Z136 Encounter for screening for cardiovascular disorders: Secondary | ICD-10-CM | POA: Diagnosis not present

## 2017-06-08 DIAGNOSIS — E785 Hyperlipidemia, unspecified: Secondary | ICD-10-CM | POA: Diagnosis not present

## 2017-06-08 DIAGNOSIS — Z9181 History of falling: Secondary | ICD-10-CM | POA: Diagnosis not present

## 2017-06-08 DIAGNOSIS — Z139 Encounter for screening, unspecified: Secondary | ICD-10-CM | POA: Diagnosis not present

## 2017-06-22 NOTE — Progress Notes (Signed)
[facility]  PATIENT: Judy Holt DOB: [birth year]   REASON FOR VISIT: Follow-up for Parkinson's disease, mild cognitive impairment gait abnormality HISTORY FROM: Daughter    HISTORY OF PRESENT ILLNESS:Judy Holt is a very pleasant 81 year old right-handed woman with an underlying medical history of depression, anxiety, migraine headaches, hypothyroidism, reflux disease, hyperlipidemia, and vitamin D deficiency, status post left wrist surgery after a fall resulting in left wrist fracture, s/p cataract repairs in 4/09, who presents for followup consultation of her right-sided predominant Parkinson's disease, complicated by mild intermittent dyskinesias, memory loss and constipation, as well as visual hallucinations and recent falls. She is accompanied by her daughter, Judy Holt, her younger daughter today. I last saw her on 12/03/2014, at which time she reported issues with her stomach, particularly with a history of hiatal hernia. She was taking Prevacid daily. She was taking Advil every day as well. She was prescribed tramadol but could not tolerate it secondary to nausea and diarrhea. She was following with GI in [place]. She was more sleepy during the day. She had no frank hallucinations. Sinemet made her nauseated. She was on Xanax for anxiety. She was on Lexapro 10 mg for this as well as a history of depression. I suggested we reduce her Mirapex to half a pill 3 times a day from 4 times a day.  She was seen in the interim by Judy Holt on 06/19/2015, at which time her MMSE was 27 out of 30, she was advised to continue with Mirapex and Sinemet. She was then seen by Judy Holt on 12/17/2015, at which time she was deemed stable of her current conditions. She was seen by Judy Holt on 06/18/2016, at which time she was doing fairly well on her current medication regimen and was advised to follow-up in 6 months.  Today, 12/21/2016 (all dictated new, as well as above  notes, some dictation done in note pad or Word, outside of chart, may appear as copied):  She reports a recent fall some 6 months ago, thankfully no major injuries. Fell in the yard, backwards. Police happened to come by and saw her, helped her up. She had a recent fall in the bedroom, bumped her head. Her PCP order Xray. She has had VH, not daily, seeing a cat, sometimes, not daily, not frightening, not very bothersome. She does not like to use the cane. She has a walker from her mother which she has not been using. She has had more problems with her hiatal hernia, including stomach pain, nausea, reflux. She also has intermittent constipation. She utilizes Benefiber in her coffee every morning. Her weight has improved a little bit. She had gone down to 93 pounds, after that he has gained a little bit of weight, currently around 100 pounds. She has a follow-up with her GI specialist soon.  UPDATE 11/7/2018CM Judy Holt, 81 year old female returns for follow-up with history of predominantly right-sided Parkinson's disease for greater than 14 years.  She is currently on Sinemet and Mirapex with good control of her symptoms.  She does not have any compulsive behaviors.  She does have a history of depression states some days I do not feel like doing anything she is currently on Prozac.  She continues to feed and dress herself.  She depends on her daughter for errands  and oversight.  She has had 2 falls recently outdoors.  She does not use an assistive device.  Sometimes she is easily frustrated especially when having a conversation and just goes  blank.  Memory score has remained stable.  She has had more problems with her hiatal hernia and also has an abdominal hernia.  Her posture is stooped when walking.  She has some hallucinations however they are not frightful.  She returns for reevaluation  REVIEW OF SYSTEMS: Full 14 system review of systems performed and notable only for those listed, all others are  neg:  Constitutional: Fatigue Cardiovascular: neg Ear/Nose/Throat: neg  Skin: neg Eyes: Blurred vision Respiratory: neg Gastroitestinal: Irritable bowel Hematology/Lymphatic: neg  Endocrine: neg Musculoskeletal: Walking difficulty Allergy/Immunology: neg Neurological: Memory loss tremors Psychiatric: Depression anxiety occasional hallucination Sleep : neg   ALLERGIES: Allergies  Allergen Reactions  . Ciprofloxacin   . Cymbalta [Duloxetine Hcl]   . Nitrofurantoin Macrocrystal   . Sulfa Antibiotics   . Codeine   . Macrobid [Nitrofurantoin]   . Mobic [Meloxicam] Itching and Swelling  . Phenergan [Promethazine]     HOME MEDICATIONS: Outpatient Medications Prior to Visit  Medication Sig Dispense Refill  . ALPRAZolam (XANAX) 0.5 MG tablet Take 0.5 mg by mouth 3 (three) times daily as needed for sleep.    . butalbital-acetaminophen-caffeine (FIORICET, ESGIC) 50-325-40 MG per tablet Take 1 tablet by mouth 2 (two) times daily as needed for headache.    . carbidopa-levodopa (SINEMET IR) 25-100 MG tablet Take 1 tablet by mouth 3 (three) times daily. 270 tablet 3  . denosumab (PROLIA) 60 MG/ML SOLN injection Inject 60 mg into the skin every 6 (six) months. Administer in upper arm, thigh, or abdomen    . dicyclomine (BENTYL) 10 MG capsule TAKE 1 CAPSULE BY MOUTH 4  TIMES DAILY - BEFORE MEALS  AND AT BEDTIME. 120 capsule 11  . FLUoxetine (PROZAC) 40 MG capsule     . fluticasone (FLONASE) 50 MCG/ACT nasal spray     . Iron-FA-B Cmp-C-Biot-Probiotic (FUSION PLUS) CAPS     . levothyroxine (SYNTHROID, LEVOTHROID) 88 MCG tablet 88 mcg daily.    . mirtazapine (REMERON) 15 MG tablet     . omeprazole (PRILOSEC) 40 MG capsule TAKE 1 CAPSULE BY MOUTH TWO TIMES DAILY 180 capsule 3  . oxybutynin (DITROPAN) 5 MG tablet Take 5 mg by mouth as directed.    . pramipexole (MIRAPEX) 1.5 MG tablet TAKE ONE-HALF TABLET BY MOUTH 3 TIMES A DAY, 9 AM, 1 PM, AND 8 PM 135 tablet 11  . levothyroxine (SYNTHROID,  LEVOTHROID) 100 MCG tablet     . dicyclomine (BENTYL) 10 MG capsule Take 1 capsule (10 mg total) by mouth 4 (four) times daily -  before meals and at bedtime. 120 capsule 0  . pramipexole (MIRAPEX) 1.5 MG tablet TAKE 1/2 TABLET BY MOUTH 3  TIMES DAILY (9AM, 1PM, 8PM) 135 tablet 3   No facility-administered medications prior to visit.     PAST MEDICAL HISTORY: Past Medical History:  Diagnosis Date  . Acid reflux disease   . Adenomatous colon polyp 09/2000  . Anemia   . Anxiety   . Chronic obstructive airway disease ([place])   . Depression   . Hiatal hernia   . Hyperlipidemia   . Hypothyroidism   . IBS (irritable bowel syndrome)   . Insomnia   . Memory loss   . Migraines   . Osteoporosis   . Parkinson's disease ([place])   . Unspecified vitamin D deficiency 11/07/2012    PAST SURGICAL HISTORY: Past Surgical History:  Procedure Laterality Date  . ABDOMINAL HYSTERECTOMY    . bladder  09/2013   bladder tac  . left  arm surgery  2004    FAMILY HISTORY: Family History  Problem Relation Age of Onset  . Alzheimer's disease Mother   . Stroke Mother   . Cancer Father   . Colon cancer Neg Hx     SOCIAL HISTORY: Social History   Socioeconomic History  . Marital status: Married    Spouse name: Not on file  . Number of children: 2  . Years of education: 12+  . Highest education level: Not on file  Social Needs  . Financial resource strain: Not on file  . Food insecurity - worry: Not on file  . Food insecurity - inability: Not on file  . Transportation needs - medical: Not on file  . Transportation needs - non-medical: Not on file  Occupational History  . Occupation: Retired  Tobacco Use  . Smoking status: Never Smoker  . Smokeless tobacco: Never Used  Substance and Sexual Activity  . Alcohol use: No    Alcohol/week: 0.0 oz  . Drug use: No  . Sexual activity: Not on file  Other Topics Concern  . Not on file  Social History Narrative   Patient is married with 2  children.   Patient is right handed.   Patient has high school education.   Patient drinks 4 cups daily.     PHYSICAL EXAM  Vitals:   06/23/17 1339  BP: 113/62  Pulse: 76  Weight: 85 lb 6.4 oz (38.7 kg)   Body mass index is 17.85 kg/m.  Generalized: Well developed, frail female in no acute distress  Head: normocephalic and atraumatic,. Oropharynx benign  Neck: Mildly rigid with full range of motion  no carotid bruits  Cardiac: Regular rate rhythm, no murmur  Musculoskeletal: No deformity   Neurological examination   Mentation: Alert AFT 12.  MMSE - Mini Mental State Exam 06/23/2017 12/21/2016 06/18/2016  Orientation to time 4 5 4   Orientation to Place 5 4 5   Registration 3 3 3   Attention/ Calculation 5 5 5   Recall 1 2 3   Language- name 2 objects 2 2 2   Language- repeat 0 1 1  Language- follow 3 step command 3 2 3   Language- read & follow direction 1 1 1   Write a sentence 1 1 1   Copy design 0 1 1  Total score 25 27 29     Follows all commands speech hypophonic and language fluent.   Cranial nerve II-XII: Fundoscopic exam reveals sharp disc margins.Pupils were equal round reactive to light extraocular movements were full except for decreased upgaze, visual field were full on confrontational test. Facial sensation and strength were normal. hearing was intact to finger rubbing bilaterally. Uvula tongue midline. head turning and shoulder shrug were normal and symmetric.Tongue protrusion into cheek strength was normal.  Negative Myerson sign.   Motor: Strength 4 out of 5 mild generalized dyskinesias tone mildly increased throughout mild intermittent right sided resting tremor normal bulk and tone, full strength in the BUE, BLE, fine finger movements normal, no pronator drift. No focal weakness Sensory: normal and symmetric to light touch,  Coordination: finger-nose-finger, heel-to-shin bilaterally, no dysmetria Reflexes: 1+ upper lower and symmetric plantar responses were flexor  bilaterally. Gait and Station: Rising up from seated position without assistance, stooped posture, walks in a good pace but decreased stride.  No decreased arm swing balance is mildly impaired she does not use an assistive device   DIAGNOSTIC DATA (LABS, IMAGING, TESTING) - ASSESSMENT AND PLAN 81 year old female with an underlying medical history of  depression, anxiety, migraine headaches, hypothyroidism, reflux disease, hyperlipidemia, and vitamin D deficiency, status post left wrist surgery after a fall resulting in left wrist fracture, s/p cataract repairs in 4/09, who presents for followup consultation of her right-sided predominant Parkinson's disease, complicated by mild intermittent dyskinesias, memory loss , visual hallucinations and recent falls. She has had stable findings for quite some time, she has progressed in the past couple of years. Unfortunately, not everything responds to increases in medication, she would be at risk for GI related side effects or low blood pressure or worsening hallucinations with increase in her Parkinson's medication. To that end, I suggested we continue with her Sinemet 1 pill 3 times a day as well as pramipexole half a pill 3 times a day.  She is encouraged to increase her water intake. For gait safety, I suggested she start using a  rolling walker, she does not have a cane or a walker and to use this rolling walker for longer distances and outside use  She has had ongoing issues with her hiatal hernia and GI related problems. She has had stable dyskinesias, develop recent visual hallucinations which we will monitor. These are not bothersome or frightening to her at this time and not daily.  She is on Prozac with good results for depression. We will monitor her memory scores as well which have been more or less stable in the past couple of years Continue Sinemet at current dose Continue Mirapex at current dose Suggest patient get a rolling walker for safer  ambulation Continue Prozac for depression Follow-up in 6 months I spent 25 minutes in total face to face time with the patient more than 50% of which was spent counseling and coordination of care, reviewing test results reviewing medications and discussing and reviewing the diagnosis of Parkinson's disease, gait abnormality and her memory loss and further treatment options. , Rayburn Ma, [facility], APRN  [facility] [address], [place], [place] [address] [phone]

## 2017-06-23 ENCOUNTER — Ambulatory Visit: Payer: Medicare Other | Admitting: Nurse Practitioner

## 2017-06-23 ENCOUNTER — Encounter: Payer: Self-pay | Admitting: Nurse Practitioner

## 2017-06-23 VITALS — BP 113/62 | HR 76 | Wt 85.4 lb

## 2017-06-23 DIAGNOSIS — R413 Other amnesia: Secondary | ICD-10-CM | POA: Diagnosis not present

## 2017-06-23 DIAGNOSIS — G20 Parkinson's disease: Secondary | ICD-10-CM

## 2017-06-23 DIAGNOSIS — R269 Unspecified abnormalities of gait and mobility: Secondary | ICD-10-CM | POA: Insufficient documentation

## 2017-06-23 MED ORDER — PRAMIPEXOLE DIHYDROCHLORIDE 1.5 MG PO TABS: ORAL_TABLET | ORAL | 3 refills | Status: DC

## 2017-06-23 NOTE — Patient Instructions (Signed)
Continue Sinemet at current dose Continue Mirapex at current dose Suggest patient get a rolling walker for safer ambulation Continue Prozac for depression Follow-up in 6 months

## 2017-08-19 DIAGNOSIS — M810 Age-related osteoporosis without current pathological fracture: Secondary | ICD-10-CM | POA: Diagnosis not present

## 2017-08-19 DIAGNOSIS — I959 Hypotension, unspecified: Secondary | ICD-10-CM | POA: Diagnosis not present

## 2017-08-19 DIAGNOSIS — Z139 Encounter for screening, unspecified: Secondary | ICD-10-CM | POA: Diagnosis not present

## 2017-08-19 DIAGNOSIS — Z2821 Immunization not carried out because of patient refusal: Secondary | ICD-10-CM | POA: Diagnosis not present

## 2017-09-06 DIAGNOSIS — N959 Unspecified menopausal and perimenopausal disorder: Secondary | ICD-10-CM | POA: Diagnosis not present

## 2017-09-06 DIAGNOSIS — M810 Age-related osteoporosis without current pathological fracture: Secondary | ICD-10-CM | POA: Diagnosis not present

## 2017-09-23 DIAGNOSIS — M810 Age-related osteoporosis without current pathological fracture: Secondary | ICD-10-CM | POA: Diagnosis not present

## 2017-09-23 DIAGNOSIS — D539 Nutritional anemia, unspecified: Secondary | ICD-10-CM | POA: Diagnosis not present

## 2017-09-23 DIAGNOSIS — E785 Hyperlipidemia, unspecified: Secondary | ICD-10-CM | POA: Diagnosis not present

## 2017-09-23 DIAGNOSIS — E039 Hypothyroidism, unspecified: Secondary | ICD-10-CM | POA: Diagnosis not present

## 2017-09-23 DIAGNOSIS — I959 Hypotension, unspecified: Secondary | ICD-10-CM | POA: Diagnosis not present

## 2017-09-23 DIAGNOSIS — E559 Vitamin D deficiency, unspecified: Secondary | ICD-10-CM | POA: Diagnosis not present

## 2017-10-05 DIAGNOSIS — M810 Age-related osteoporosis without current pathological fracture: Secondary | ICD-10-CM | POA: Diagnosis not present

## 2017-12-20 NOTE — Progress Notes (Addendum)
[facility]  PATIENT: Judy Holt DOB: 01/10/1936   REASON FOR VISIT: Follow-up for Parkinson's disease, memory loss  gait abnormality HISTORY FROM: Daughter and patient    HISTORY OF PRESENT ILLNESS:Judy Holt is a very pleasant 82 year old right-handed woman with an underlying medical history of depression, anxiety, migraine headaches, hypothyroidism, reflux disease, hyperlipidemia, and vitamin D deficiency, status post left wrist surgery after a fall resulting in left wrist fracture, s/p cataract repairs in 4/09, who presents for followup consultation of her right-sided predominant Parkinson's disease, complicated by mild intermittent dyskinesias, memory loss and constipation, as well as visual hallucinations and recent falls. She is accompanied by her daughter, Judy Holt, her younger daughter today. I last saw her on 12/03/2014, at which time she reported issues with her stomach, particularly with a history of hiatal hernia. She was taking Prevacid daily. She was taking Advil every day as well. She was prescribed tramadol but could not tolerate it secondary to nausea and diarrhea. She was following with GI in [place]. She was more sleepy during the day. She had no frank hallucinations. Sinemet made her nauseated. She was on Xanax for anxiety. She was on Lexapro 10 mg for this as well as a history of depression. I suggested we reduce her Mirapex to half a pill 3 times a day from 4 times a day.  She was seen in the interim by Judy Holt on 06/19/2015, at which time her MMSE was 27 out of 30, she was advised to continue with Mirapex and Sinemet. She was then seen by Judy Holt on 12/17/2015, at which time she was deemed stable of her current conditions. She was seen by Judy Holt on 06/18/2016, at which time she was doing fairly well on her current medication regimen and was advised to follow-up in 6 months.  Today, 12/21/2016 (all dictated new, as well as above  notes, some dictation done in note pad or Word, outside of chart, may appear as copied):  She reports a recent fall some 6 months ago, thankfully no major injuries. Fell in the yard, backwards. Police happened to come by and saw her, helped her up. She had a recent fall in the bedroom, bumped her head. Her PCP order Xray. She has had VH, not daily, seeing a cat, sometimes, not daily, not frightening, not very bothersome. She does not like to use the cane. She has a walker from her mother which she has not been using. She has had more problems with her hiatal hernia, including stomach pain, nausea, reflux. She also has intermittent constipation. She utilizes Benefiber in her coffee every morning. Her weight has improved a little bit. She had gone down to 93 pounds, after that he has gained a little bit of weight, currently around 100 pounds. She has a follow-up with her GI specialist soon.  UPDATE 11/7/2018CM Judy Holt, 82 year old female returns for follow-up with history of predominantly right-sided Parkinson's disease for greater than 14 years.  She is currently on Sinemet and Mirapex with good control of her symptoms.  She does not have any compulsive behaviors.  She does have a history of depression states some days I do not feel like doing anything she is currently on Prozac.  She continues to feed and dress herself.  She depends on her daughter for errands  and oversight.  She has had 2 falls recently outdoors.  She does not use an assistive device.  Sometimes she is easily frustrated especially when having a conversation and  just goes blank.  Memory score has remained stable.  She has had more problems with her hiatal hernia and also has an abdominal hernia.  Her posture is stooped when walking.  She has some hallucinations however they are not frightful.  She returns for reevaluation UPDATE 5/7/2019CM Judy Holt, 82 year old female returns for follow-up with history of predominantly right-sided  Parkinson's disease for greater than 15 years is currently on Sinemet and Mirapex with good control of her symptoms.  No falls since last seen.  She denies any compulsive behavior.  She does have a history of depression with good and bad days she is currently on Prozac.  She continues to feed and dress herself no assistive device although she has been encouraged to get one memory score has declined since last seen blood pressure noted to be low today at 82/47.  She has poor water intake.  She mostly drinks tea.  She denies any hallucinations that are frightful.  She returns for reevaluation REVIEW OF SYSTEMS: Full 14 system review of systems performed and notable only for those listed, all others are neg:  Constitutional: Fatigue Cardiovascular: neg Ear/Nose/Throat: Hearing loss Skin: neg Eyes: Blurred vision, light sensitivity Respiratory: neg Gastroitestinal: Irritable bowel Hematology/Lymphatic: neg  Endocrine: neg Musculoskeletal: Walking difficulty Allergy/Immunology: neg Neurological: Memory loss tremors, dizziness Psychiatric: Depression anxiety occasional hallucination Sleep : neg   ALLERGIES: Allergies  Allergen Reactions  . Ciprofloxacin   . Cymbalta [Duloxetine Hcl]   . Nitrofurantoin Macrocrystal   . Sulfa Antibiotics   . Codeine   . Macrobid [Nitrofurantoin]   . Mobic [Meloxicam] Itching and Swelling  . Phenergan [Promethazine]     HOME MEDICATIONS: Outpatient Medications Prior to Visit  Medication Sig Dispense Refill  . ALPRAZolam (XANAX) 0.5 MG tablet Take 0.5 mg by mouth 3 (three) times daily as needed for sleep.    . butalbital-acetaminophen-caffeine (FIORICET, ESGIC) 50-325-40 MG per tablet Take 1 tablet by mouth 2 (two) times daily as needed for headache.    . carbidopa-levodopa (SINEMET IR) 25-100 MG tablet Take 1 tablet by mouth 3 (three) times daily. 270 tablet 3  . denosumab (PROLIA) 60 MG/ML SOLN injection Inject 60 mg into the skin every 6 (six) months.  Administer in upper arm, thigh, or abdomen    . dicyclomine (BENTYL) 10 MG capsule TAKE 1 CAPSULE BY MOUTH 4  TIMES DAILY - BEFORE MEALS  AND AT BEDTIME. 120 capsule 11  . FLUoxetine (PROZAC) 40 MG capsule     . fluticasone (FLONASE) 50 MCG/ACT nasal spray     . Iron-FA-B Cmp-C-Biot-Probiotic (FUSION PLUS) CAPS     . levothyroxine (SYNTHROID, LEVOTHROID) 88 MCG tablet 88 mcg daily.    . mirtazapine (REMERON) 15 MG tablet     . omeprazole (PRILOSEC) 40 MG capsule TAKE 1 CAPSULE BY MOUTH TWO TIMES DAILY 180 capsule 3  . oxybutynin (DITROPAN) 5 MG tablet Take 5 mg by mouth as directed.    . pramipexole (MIRAPEX) 1.5 MG tablet TAKE ONE-HALF TABLET BY MOUTH 3 TIMES A DAY, 9 AM, 1 PM, AND 8 PM 135 tablet 3   No facility-administered medications prior to visit.     PAST MEDICAL HISTORY: Past Medical History:  Diagnosis Date  . Acid reflux disease   . Adenomatous colon polyp 09/2000  . Anemia   . Anxiety   . Chronic obstructive airway disease ([place])   . Depression   . Hiatal hernia   . Hyperlipidemia   . Hypothyroidism   . IBS (irritable  bowel syndrome)   . Insomnia   . Memory loss   . Migraines   . Osteoporosis   . Parkinson's disease ([place])   . Unspecified vitamin D deficiency 11/07/2012    PAST SURGICAL HISTORY: Past Surgical History:  Procedure Laterality Date  . ABDOMINAL HYSTERECTOMY    . bladder  09/2013   bladder tac  . left arm surgery  2004    FAMILY HISTORY: Family History  Problem Relation Age of Onset  . Alzheimer's disease Mother   . Stroke Mother   . Cancer Father   . Colon cancer Neg Hx     SOCIAL HISTORY: Social History   Socioeconomic History  . Marital status: Married    Spouse name: Not on file  . Number of children: 2  . Years of education: 12+  . Highest education level: Not on file  Occupational History  . Occupation: Retired  Scientific laboratory technician  . Financial resource strain: Not on file  . Food insecurity:    Worry: Not on file    Inability:  Not on file  . Transportation needs:    Medical: Not on file    Non-medical: Not on file  Tobacco Use  . Smoking status: Never Smoker  . Smokeless tobacco: Never Used  Substance and Sexual Activity  . Alcohol use: No    Alcohol/week: 0.0 oz  . Drug use: No  . Sexual activity: Not on file  Lifestyle  . Physical activity:    Days per week: Not on file    Minutes per session: Not on file  . Stress: Not on file  Relationships  . Social connections:    Talks on phone: Not on file    Gets together: Not on file    Attends religious service: Not on file    Active member of club or organization: Not on file    Attends meetings of clubs or organizations: Not on file    Relationship status: Not on file  . Intimate partner violence:    Fear of current or ex partner: Not on file    Emotionally abused: Not on file    Physically abused: Not on file    Forced sexual activity: Not on file  Other Topics Concern  . Not on file  Social History Narrative   Patient is married with 2 children. Lives with spouse   Patient is right handed.   Patient has high school education.   Patient drinks 4 cups daily.     PHYSICAL EXAM  Vitals:   12/21/17 1420  BP: (!) 82/47  Pulse: 84  Weight: 84 lb 12.8 oz (38.5 kg)  Height: 4\' 10"  (1.473 m)   Body mass index is 17.72 kg/m.  Generalized: Well developed, frail female in no acute distress  Head: normocephalic and atraumatic,. Oropharynx benign  Neck: Mildly rigid with full range of motion  no carotid bruits  Cardiac: Regular rate rhythm, no murmur  Musculoskeletal: No deformity   Neurological examination   Mentation: Alert AFT 12.  MMSE - Mini Mental State Exam 12/21/2017 06/23/2017 12/21/2016  Orientation to time 0 4 5  Orientation to Place 4 5 4   Registration 3 3 3   Attention/ Calculation 0 5 5  Recall 1 1 2   Language- name 2 objects 2 2 2   Language- repeat 0 0 1  Language- follow 3 step command 3 3 2   Language- read & follow direction  1 1 1   Write a sentence 0 1 1  Write  a sentence-comments no subject (implied) - -  Copy design 0 0 1  Copy design-comments traced design - -  Total score 14 25 27     Follows all commands speech hypophonic and language fluent.   Cranial nerve II-XII: Pupils were equal round reactive to light extraocular movements were full except for decreased upgaze, visual field were full on confrontational test. Facial sensation and strength were normal. hearing was intact to finger rubbing bilaterally. Uvula tongue midline. head turning and shoulder shrug were normal and symmetric.Tongue protrusion into cheek strength was normal.  Negative Myerson sign.   Motor: Strength 4 out of 5 , mild generalized dyskinesias tone mildly increased throughout mild intermittent right sided resting tremor normal bulk and tone, full strength in the BUE, BLE, fine finger movements normal, no pronator drift. No focal weakness Sensory: normal and symmetric to light touch,  Coordination: finger-nose-finger, heel-to-shin bilaterally, no dysmetria Reflexes: 1+ upper lower and symmetric plantar responses were flexor bilaterally. Gait and Station: Rising up from seated position without assistance, stooped posture, walks in a good pace but decreased stride.  No decreased arm swing balance is mildly impaired she does not use an assistive device .  She cannot tandem.  DIAGNOSTIC DATA (LABS, IMAGING, TESTING) - ASSESSMENT AND PLAN 82 year old female with an underlying medical history of depression, anxiety, migraine headaches, hypothyroidism, reflux disease, hyperlipidemia, and vitamin D deficiency, status post left wrist surgery after a fall resulting in left wrist fracture, s/p cataract repairs in 4/09, who presents for followup consultation of her right-sided predominant Parkinson's disease, complicated by mild intermittent dyskinesias, memory loss , visual hallucinations. She has had stable findings for quite some time, she has  progressed in the past couple of years. Unfortunately, not everything responds to increases in medication, she would be at risk for GI related side effects or low blood pressure or worsening hallucinations with increase in her Parkinson's medication. To that end, I suggested we continue with her Sinemet 1 pill 3 times a day as well as pramipexole half a pill 3 times a day.  She is encouraged to increase her water intake. For gait safety, I suggested she start using a  rolling walker, she does not have a cane or a walker and to use this rolling walker for longer distances and outside use  She has had stable dyskinesias, develop recent visual hallucinations which we will monitor. These are not bothersome or frightening to her at this time and not daily.  She is on Prozac  for depression. We will monitor her memory scores as well which have been more or less stable in the past couple of years  Continue Sinemet at current dose Continue Mirapex at current dose Increase water intake to prevent dehydration Continue Prozac for depression Follow-up in 3  Months for repeat MMSE I spent 25 minutes in total face to face time with the patient more than 50% of which was spent counseling and coordination of care, reviewing test results reviewing medications and discussing and reviewing the diagnosis of Parkinson's disease, gait abnormality and her memory loss and further treatment options. , Dennie Bible, [facility], [facility], APRN  [facility] [address], [place], [place] [address] [phone]  I reviewed the above note and documentation by the Nurse Practitioner and agree with the history, physical exam, assessment and plan as outlined above. I was immediately available for face-to-face consultation. Star Age, MD, PhD [facility])

## 2017-12-21 ENCOUNTER — Ambulatory Visit: Payer: Medicare Other | Admitting: Nurse Practitioner

## 2017-12-21 ENCOUNTER — Encounter: Payer: Self-pay | Admitting: Nurse Practitioner

## 2017-12-21 VITALS — BP 82/47 | HR 84 | Ht <= 58 in | Wt 84.8 lb

## 2017-12-21 DIAGNOSIS — G249 Dystonia, unspecified: Secondary | ICD-10-CM | POA: Diagnosis not present

## 2017-12-21 DIAGNOSIS — F329 Major depressive disorder, single episode, unspecified: Secondary | ICD-10-CM

## 2017-12-21 DIAGNOSIS — R413 Other amnesia: Secondary | ICD-10-CM

## 2017-12-21 DIAGNOSIS — F32A Depression, unspecified: Secondary | ICD-10-CM

## 2017-12-21 DIAGNOSIS — G20 Parkinson's disease: Secondary | ICD-10-CM

## 2017-12-21 DIAGNOSIS — R269 Unspecified abnormalities of gait and mobility: Secondary | ICD-10-CM

## 2017-12-21 MED ORDER — CARBIDOPA-LEVODOPA 25-100 MG PO TABS
1.0000 | ORAL_TABLET | Freq: Three times a day (TID) | ORAL | 3 refills | Status: DC
Start: 2017-12-21 — End: 2018-09-07

## 2017-12-21 NOTE — Patient Instructions (Signed)
Continue Sinemet at current dose Continue Mirapex at current dose Increase water intake to prevent dehydration Continue Prozac for depression Follow-up in 3  Months for repeat MMSE

## 2017-12-23 ENCOUNTER — Other Ambulatory Visit: Payer: Self-pay | Admitting: Gastroenterology

## 2017-12-23 DIAGNOSIS — K588 Other irritable bowel syndrome: Secondary | ICD-10-CM

## 2017-12-23 DIAGNOSIS — G20 Parkinson's disease: Secondary | ICD-10-CM | POA: Diagnosis not present

## 2017-12-23 DIAGNOSIS — M810 Age-related osteoporosis without current pathological fracture: Secondary | ICD-10-CM | POA: Diagnosis not present

## 2017-12-23 DIAGNOSIS — K219 Gastro-esophageal reflux disease without esophagitis: Secondary | ICD-10-CM

## 2018-03-07 DIAGNOSIS — H40053 Ocular hypertension, bilateral: Secondary | ICD-10-CM | POA: Diagnosis not present

## 2018-03-29 DIAGNOSIS — E039 Hypothyroidism, unspecified: Secondary | ICD-10-CM | POA: Diagnosis not present

## 2018-03-29 DIAGNOSIS — J449 Chronic obstructive pulmonary disease, unspecified: Secondary | ICD-10-CM | POA: Diagnosis not present

## 2018-03-29 DIAGNOSIS — G20 Parkinson's disease: Secondary | ICD-10-CM | POA: Diagnosis not present

## 2018-03-29 DIAGNOSIS — M810 Age-related osteoporosis without current pathological fracture: Secondary | ICD-10-CM | POA: Diagnosis not present

## 2018-04-16 ENCOUNTER — Other Ambulatory Visit: Payer: Self-pay | Admitting: Gastroenterology

## 2018-04-16 DIAGNOSIS — K588 Other irritable bowel syndrome: Secondary | ICD-10-CM

## 2018-04-16 DIAGNOSIS — K219 Gastro-esophageal reflux disease without esophagitis: Secondary | ICD-10-CM

## 2018-04-26 NOTE — Progress Notes (Signed)
[facility]  PATIENT: Judy Holt DOB: 01/10/1936   REASON FOR VISIT: Follow-up for Parkinson's disease, memory loss  gait abnormality HISTORY FROM: Daughter and patient    HISTORY OF PRESENT ILLNESS:Judy Holt is a very pleasant 82 year old right-handed woman with an underlying medical history of depression, anxiety, migraine headaches, hypothyroidism, reflux disease, hyperlipidemia, and vitamin D deficiency, status post left wrist surgery after a fall resulting in left wrist fracture, s/p cataract repairs in 4/09, who presents for followup consultation of her right-sided predominant Parkinson's disease, complicated by mild intermittent dyskinesias, memory loss and constipation, as well as visual hallucinations and recent falls. She is accompanied by her daughter, Judy Holt, her younger daughter today. I last saw her on 12/03/2014, at which time she reported issues with her stomach, particularly with a history of hiatal hernia. She was taking Prevacid daily. She was taking Advil every day as well. She was prescribed tramadol but could not tolerate it secondary to nausea and diarrhea. She was following with GI in [place]. She was more sleepy during the day. She had no frank hallucinations. Sinemet made her nauseated. She was on Xanax for anxiety. She was on Lexapro 10 mg for this as well as a history of depression. I suggested we reduce her Mirapex to half a pill 3 times a day from 4 times a day.  She was seen in the interim by Cecille Rubin on 06/19/2015, at which time her MMSE was 27 out of 30, she was advised to continue with Mirapex and Sinemet. She was then seen by Cecille Rubin on 12/17/2015, at which time she was deemed stable of her current conditions. She was seen by Cecille Rubin on 06/18/2016, at which time she was doing fairly well on her current medication regimen and was advised to follow-up in 6 months.  Today, 12/21/2016 (all dictated new, as well as above  notes, some dictation done in note pad or Word, outside of chart, may appear as copied):  She reports a recent fall some 6 months ago, thankfully no major injuries. Fell in the yard, backwards. Police happened to come by and saw her, helped her up. She had a recent fall in the bedroom, bumped her head. Her PCP order Xray. She has had VH, not daily, seeing a cat, sometimes, not daily, not frightening, not very bothersome. She does not like to use the cane. She has a walker from her mother which she has not been using. She has had more problems with her hiatal hernia, including stomach pain, nausea, reflux. She also has intermittent constipation. She utilizes Benefiber in her coffee every morning. Her weight has improved a little bit. She had gone down to 93 pounds, after that he has gained a little bit of weight, currently around 100 pounds. She has a follow-up with her GI specialist soon.  UPDATE 11/7/2018CM Judy Holt, 82 year old female returns for follow-up with history of predominantly right-sided Parkinson's disease for greater than 14 years.  She is currently on Sinemet and Mirapex with good control of her symptoms.  She does not have any compulsive behaviors.  She does have a history of depression states some days I do not feel like doing anything she is currently on Prozac.  She continues to feed and dress herself.  She depends on her daughter for errands  and oversight.  She has had 2 falls recently outdoors.  She does not use an assistive device.  Sometimes she is easily frustrated especially when having a conversation and  just goes blank.  Memory score has remained stable.  She has had more problems with her hiatal hernia and also has an abdominal hernia.  Her posture is stooped when walking.  She has some hallucinations however they are not frightful.  She returns for reevaluation UPDATE 5/7/2019CM Judy Holt, 82 year old female returns for follow-up with history of predominantly right-sided  Parkinson's disease for greater than 15 years is currently on Sinemet and Mirapex with good control of her symptoms.  No falls since last seen.  She denies any compulsive behavior.  She does have a history of depression with good and bad days she is currently on Prozac.  She continues to feed and dress herself no assistive device although she has been encouraged to get one memory score has declined since last seen blood pressure noted to be low today at 82/47.  She has poor water intake.  She mostly drinks tea.  She denies any hallucinations that are frightful.  She returns for reevaluation UPDATE 9/11/2019CM Judy Holt, 82 year old female returns for follow-up with history of predominantly right-sided Parkinson's disease for greater than 15-1/2 years.  She is currently on Mirapex and Sinemet with good control of her symptoms.  She has had a few falls since last seen.  She has not had any compulsive behaviors she does have a history of depression.  She continues to feed and dress herself be independent in all activities of daily living.  She is gained 2 pounds since last seen she still has not obtained a cane or rolling walker.  She continues to have poor water intake mostly drinking tea.  She denies any hallucinations today.  Memory score has improved.  She remains independent in all activities of daily living.  She requires assistance to shop , prepair meals, travel, medication preparation and managing her money. She returns for reevaluation  REVIEW OF SYSTEMS: Full 14 system review of systems performed and notable only for those listed, all others are neg:  Constitutional: neg Cardiovascular: neg Ear/Nose/Throat: Hearing loss Skin: neg Eyes: Blurred vision, light sensitivity Respiratory: neg Gastroitestinal: Irritable bowel Hematology/Lymphatic: neg  Endocrine: neg Musculoskeletal: Walking difficulty Allergy/Immunology: neg Neurological: Memory loss tremors,  Psychiatric: Depression anxiety  Sleep  : Restless leg   ALLERGIES: Allergies  Allergen Reactions  . Ciprofloxacin   . Cymbalta [Duloxetine Hcl]   . Nitrofurantoin Macrocrystal   . Sulfa Antibiotics   . Codeine   . Macrobid [Nitrofurantoin]   . Mobic [Meloxicam] Itching and Swelling  . Phenergan [Promethazine]     HOME MEDICATIONS: Outpatient Medications Prior to Visit  Medication Sig Dispense Refill  . ALPRAZolam (XANAX) 0.5 MG tablet Take 0.5 mg by mouth 3 (three) times daily as needed for sleep.    . butalbital-acetaminophen-caffeine (FIORICET, ESGIC) 50-325-40 MG per tablet Take 1 tablet by mouth 2 (two) times daily as needed for headache.    . carbidopa-levodopa (SINEMET IR) 25-100 MG tablet Take 1 tablet by mouth 3 (three) times daily. 270 tablet 3  . denosumab (PROLIA) 60 MG/ML SOLN injection Inject 60 mg into the skin every 6 (six) months. Administer in upper arm, thigh, or abdomen    . dicyclomine (BENTYL) 10 MG capsule TAKE 1 CAPSULE BY MOUTH 4  TIMES DAILY - BEFORE MEALS  AND AT BEDTIME. 120 capsule 11  . FLUoxetine (PROZAC) 40 MG capsule     . fluticasone (FLONASE) 50 MCG/ACT nasal spray     . Iron-FA-B Cmp-C-Biot-Probiotic (FUSION PLUS) CAPS     . levothyroxine (SYNTHROID, LEVOTHROID)  75 MCG tablet 75 mcg daily.    . mirtazapine (REMERON) 15 MG tablet     . omeprazole (PRILOSEC) 40 MG capsule TAKE 1 CAPSULE BY MOUTH TWO TIMES DAILY 180 capsule 0  . oxybutynin (DITROPAN) 5 MG tablet Take 5 mg by mouth as directed.    . pramipexole (MIRAPEX) 1.5 MG tablet TAKE ONE-HALF TABLET BY MOUTH 3 TIMES A DAY, 9 AM, 1 PM, AND 8 PM 135 tablet 3  . levothyroxine (SYNTHROID, LEVOTHROID) 88 MCG tablet 88 mcg daily.     No facility-administered medications prior to visit.     PAST MEDICAL HISTORY: Past Medical History:  Diagnosis Date  . Acid reflux disease   . Adenomatous colon polyp 09/2000  . Anemia   . Anxiety   . Chronic obstructive airway disease ([place])   . Depression   . Hiatal hernia   . Hyperlipidemia   .  Hypothyroidism   . IBS (irritable bowel syndrome)   . Insomnia   . Memory loss   . Migraines   . Osteoporosis   . Parkinson's disease ([place])   . Unspecified vitamin D deficiency 11/07/2012    PAST SURGICAL HISTORY: Past Surgical History:  Procedure Laterality Date  . ABDOMINAL HYSTERECTOMY    . bladder  09/2013   bladder tac  . left arm surgery  2004    FAMILY HISTORY: Family History  Problem Relation Age of Onset  . Alzheimer's disease Mother   . Stroke Mother   . Cancer Father   . Colon cancer Neg Hx     SOCIAL HISTORY: Social History   Socioeconomic History  . Marital status: Married    Spouse name: Not on file  . Number of children: 2  . Years of education: 12+  . Highest education level: Not on file  Occupational History  . Occupation: Retired  Scientific laboratory technician  . Financial resource strain: Not on file  . Food insecurity:    Worry: Not on file    Inability: Not on file  . Transportation needs:    Medical: Not on file    Non-medical: Not on file  Tobacco Use  . Smoking status: Never Smoker  . Smokeless tobacco: Never Used  Substance and Sexual Activity  . Alcohol use: No    Alcohol/week: 0.0 standard drinks  . Drug use: No  . Sexual activity: Not on file  Lifestyle  . Physical activity:    Days per week: Not on file    Minutes per session: Not on file  . Stress: Not on file  Relationships  . Social connections:    Talks on phone: Not on file    Gets together: Not on file    Attends religious service: Not on file    Active member of club or organization: Not on file    Attends meetings of clubs or organizations: Not on file    Relationship status: Not on file  . Intimate partner violence:    Fear of current or ex partner: Not on file    Emotionally abused: Not on file    Physically abused: Not on file    Forced sexual activity: Not on file  Other Topics Concern  . Not on file  Social History Narrative   Patient is married with 2 children. Lives  with spouse   Patient is right handed.   Patient has high school education.   Patient drinks 4 cups daily.     PHYSICAL EXAM  Vitals:   04/27/18  1349  BP: 121/62  Pulse: 63  Weight: 86 lb 12.8 oz (39.4 kg)  Height: 4\' 10"  (1.473 m)   Body mass index is 18.14 kg/m.  Generalized: Well developed, frail female in no acute distress  Head: normocephalic and atraumatic,. Oropharynx benign  Neck: Mildly rigid with full range of motion  no carotid bruits  Cardiac: Regular rate rhythm, no murmur  Musculoskeletal: No deformity   Neurological examination   Mentation: Alert AFT 8.Clock drawing 2/4.  MMSE - Mini Mental State Exam 04/27/2018 12/21/2017 06/23/2017  Orientation to time 2 0 4  Orientation to Place 5 4 5   Registration 2 3 3   Attention/ Calculation 4 0 5  Recall 2 1 1   Language- name 2 objects 2 2 2   Language- repeat 1 0 0  Language- follow 3 step command 3 3 3   Language- read & follow direction 1 1 1   Write a sentence 1 0 1  Write a sentence-comments - no subject (implied) -  Copy design 0 0 0  Copy design-comments - traced design -  Total score 23 14 25      Follows all commands speech hypophonic and language fluent.   Cranial nerve II-XII: Pupils were equal round reactive to light extraocular movements were full except for decreased upgaze, visual field were full on confrontational test. Facial sensation and strength were normal. hearing was intact to finger rubbing bilaterally. Uvula tongue midline. head turning and shoulder shrug were normal and symmetric.Tongue protrusion into cheek strength was normal.  Negative Myerson sign.   Motor: Strength 4 out of 5 , tone mildly increased throughout mild intermittent right sided resting tremor normal bulk and tone, full strength in the BUE, BLE, fine finger movements normal, no pronator drift. No focal weakness Sensory: normal and symmetric to light touch,  Coordination: finger-nose-finger, heel-to-shin bilaterally, no  dysmetria Reflexes: 1+ upper lower and symmetric plantar responses were flexor bilaterally. Gait and Station: Rising up from seated position without assistance, stooped posture, walks in a good pace but decreased stride.  No decreased arm swing balance is mildly impaired she does not use an assistive device .  She cannot tandem.  DIAGNOSTIC DATA (LABS, IMAGING, TESTING) - ASSESSMENT AND PLAN 82 year old female with an underlying medical history of depression, anxiety, migraine headaches, hypothyroidism, reflux disease, hyperlipidemia, and vitamin D deficiency, status post left wrist surgery after a fall resulting in left wrist fracture, s/p cataract repairs in 4/09, who presents for followup consultation of her right-sided predominant Parkinson's disease, complicated by mild intermittent dyskinesias, memory loss , visual hallucinations. She has had stable findings for quite some time, she has progressed in the past couple of years. Unfortunately, not everything responds to increases in medication, she would be at risk for GI related side effects or low blood pressure or worsening hallucinations with increase in her Parkinson's medication. To that end, I suggested we continue with her Sinemet 1 pill 3 times a day as well as pramipexole half a pill 3 times a day.  She is encouraged to increase her water intake. For gait safety, I suggested she start using a  rolling walker, she does not have a cane or a walker and to use this rolling walker for longer distances and outside use  She has had stable dyskinesias, denies  hallucinations.  She is on Prozac  for depression. We will monitor her memory scores as well which have been more or less stable in the past couple of years  Continue Sinemet at current  dose Continue Mirapex at current dose will refill Increase water intake to prevent dehydration, limit tea MMSE stable Continue Prozac for depression Follow-up in 6  Months , next with DR Rexene Alberts I spent 25  minutes in total face to face time with the patient more than 50% of which was spent counseling and coordination of care, reviewing test results reviewing medications and discussing and reviewing the diagnosis of Parkinson's disease, gait abnormality and her memory loss and further treatment options. , Dennie Bible, [facility], [facility], APRN  [facility] [address], [place], [place] [address] [phone]  I reviewed the above note and documentation by the Nurse Practitioner and agree with the history, physical exam, assessment and plan as outlined above. I was immediately available for face-to-face consultation. Star Age, MD, PhD [facility])

## 2018-04-27 ENCOUNTER — Ambulatory Visit: Payer: Medicare Other | Admitting: Nurse Practitioner

## 2018-04-27 ENCOUNTER — Encounter: Payer: Self-pay | Admitting: Nurse Practitioner

## 2018-04-27 VITALS — BP 121/62 | HR 63 | Ht <= 58 in | Wt 86.8 lb

## 2018-04-27 DIAGNOSIS — R269 Unspecified abnormalities of gait and mobility: Secondary | ICD-10-CM | POA: Diagnosis not present

## 2018-04-27 DIAGNOSIS — G20 Parkinson's disease: Secondary | ICD-10-CM | POA: Diagnosis not present

## 2018-04-27 DIAGNOSIS — R413 Other amnesia: Secondary | ICD-10-CM | POA: Diagnosis not present

## 2018-04-27 DIAGNOSIS — F329 Major depressive disorder, single episode, unspecified: Secondary | ICD-10-CM | POA: Diagnosis not present

## 2018-04-27 DIAGNOSIS — F32A Depression, unspecified: Secondary | ICD-10-CM

## 2018-04-27 MED ORDER — PRAMIPEXOLE DIHYDROCHLORIDE 1.5 MG PO TABS: ORAL_TABLET | ORAL | 3 refills | Status: DC

## 2018-04-27 NOTE — Patient Instructions (Signed)
Continue Sinemet at current dose Continue Mirapex at current dose will refill Increase water intake to prevent dehydration, limit tea Continue Prozac for depression Follow-up in 6  Months , next with DR Rexene Alberts

## 2018-04-28 IMAGING — CR DG FOREARM 2V*R*
2 series · 2 of 2 positions shown · non-contrast
Comparison: Right wrist and hand radiographs obtained at the same
time.

CLINICAL DATA: Right forearm pain following a fall today.

EXAM:
RIGHT FOREARM - 2 VIEW

[forearm ap]
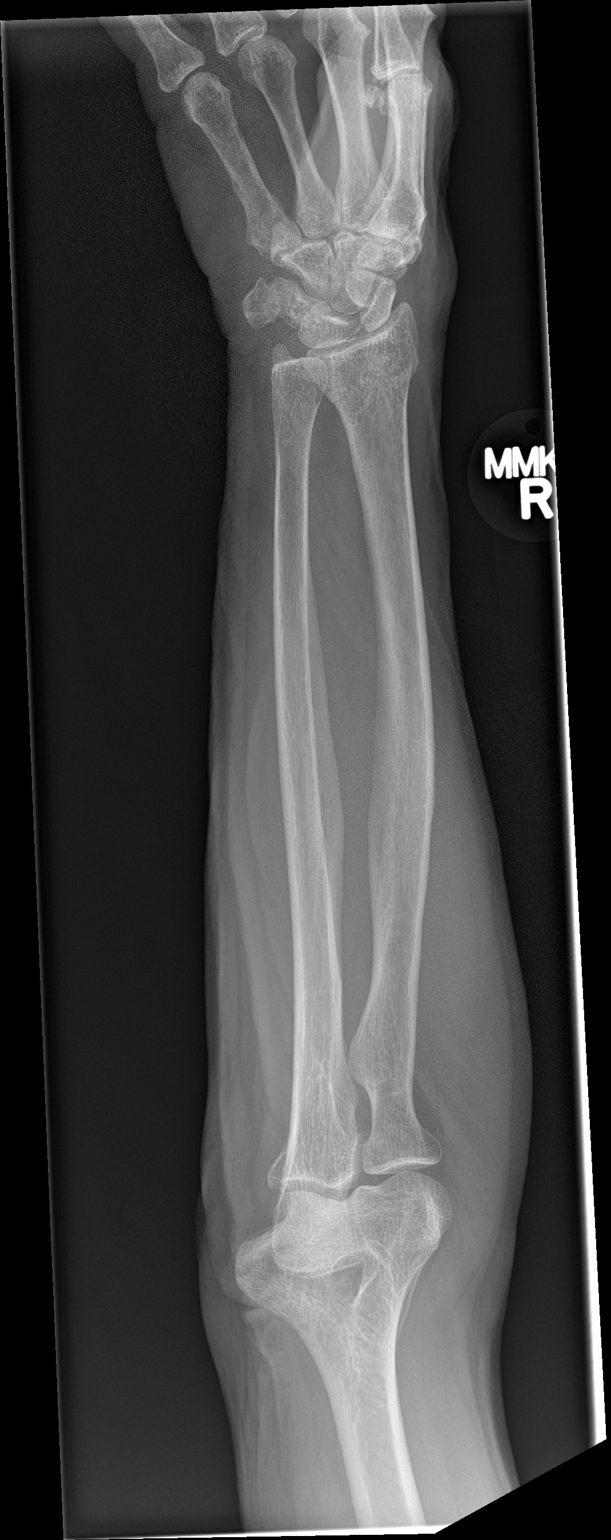

[forearm lat]
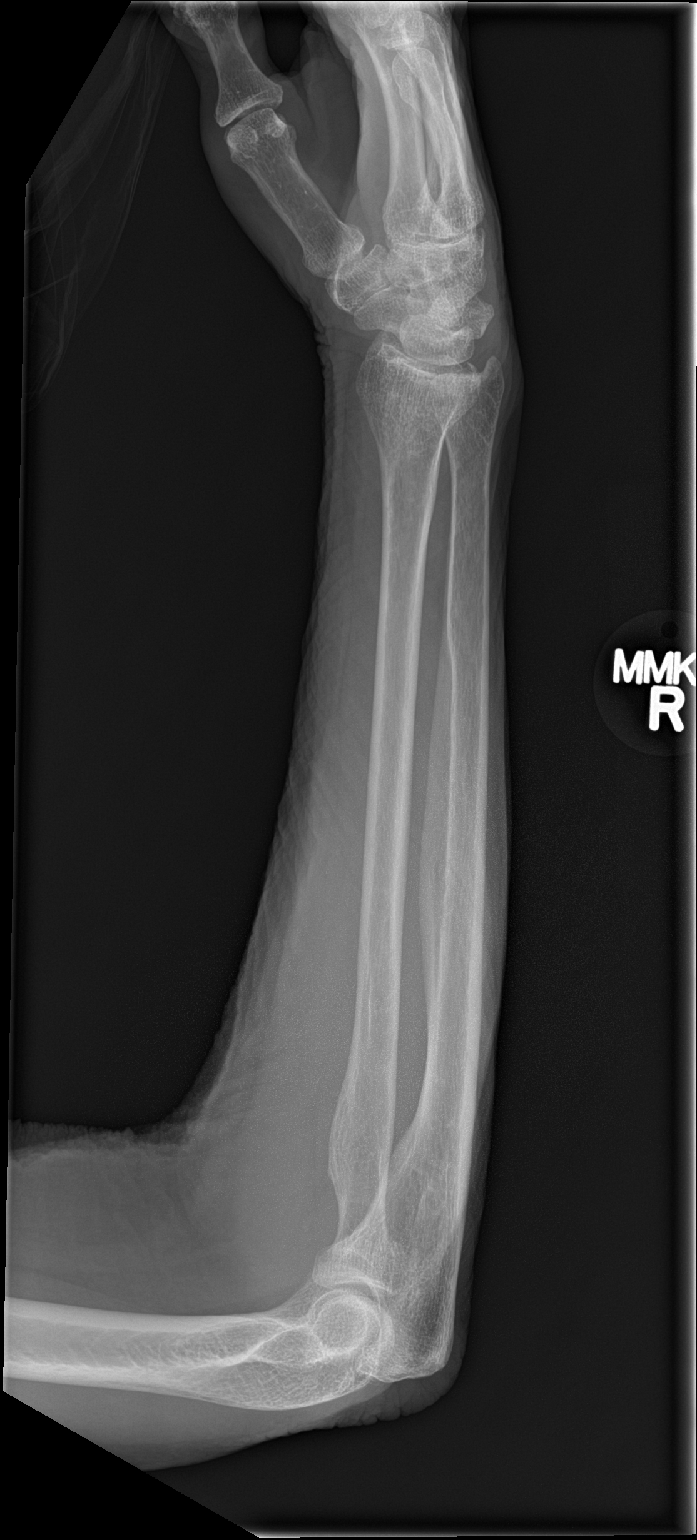

[2 of 2 positions shown; findings below may reference images not displayed]

FINDINGS: Previously described nondisplaced fracture of the distal radial
metaphysis. No angulation. No other fractures are seen.
IMPRESSION: Nondisplaced distal radial metaphysis fracture.

## 2018-04-28 IMAGING — CR DG HAND COMPLETE 3+V*R*
3 series · 3 of 3 positions shown · non-contrast
Comparison: Right wrist and forearm radiographs obtained at the
same time.

CLINICAL DATA: Right hand pain following a fall today.

EXAM:
RIGHT HAND - COMPLETE 3+ VIEW

[hand pa]
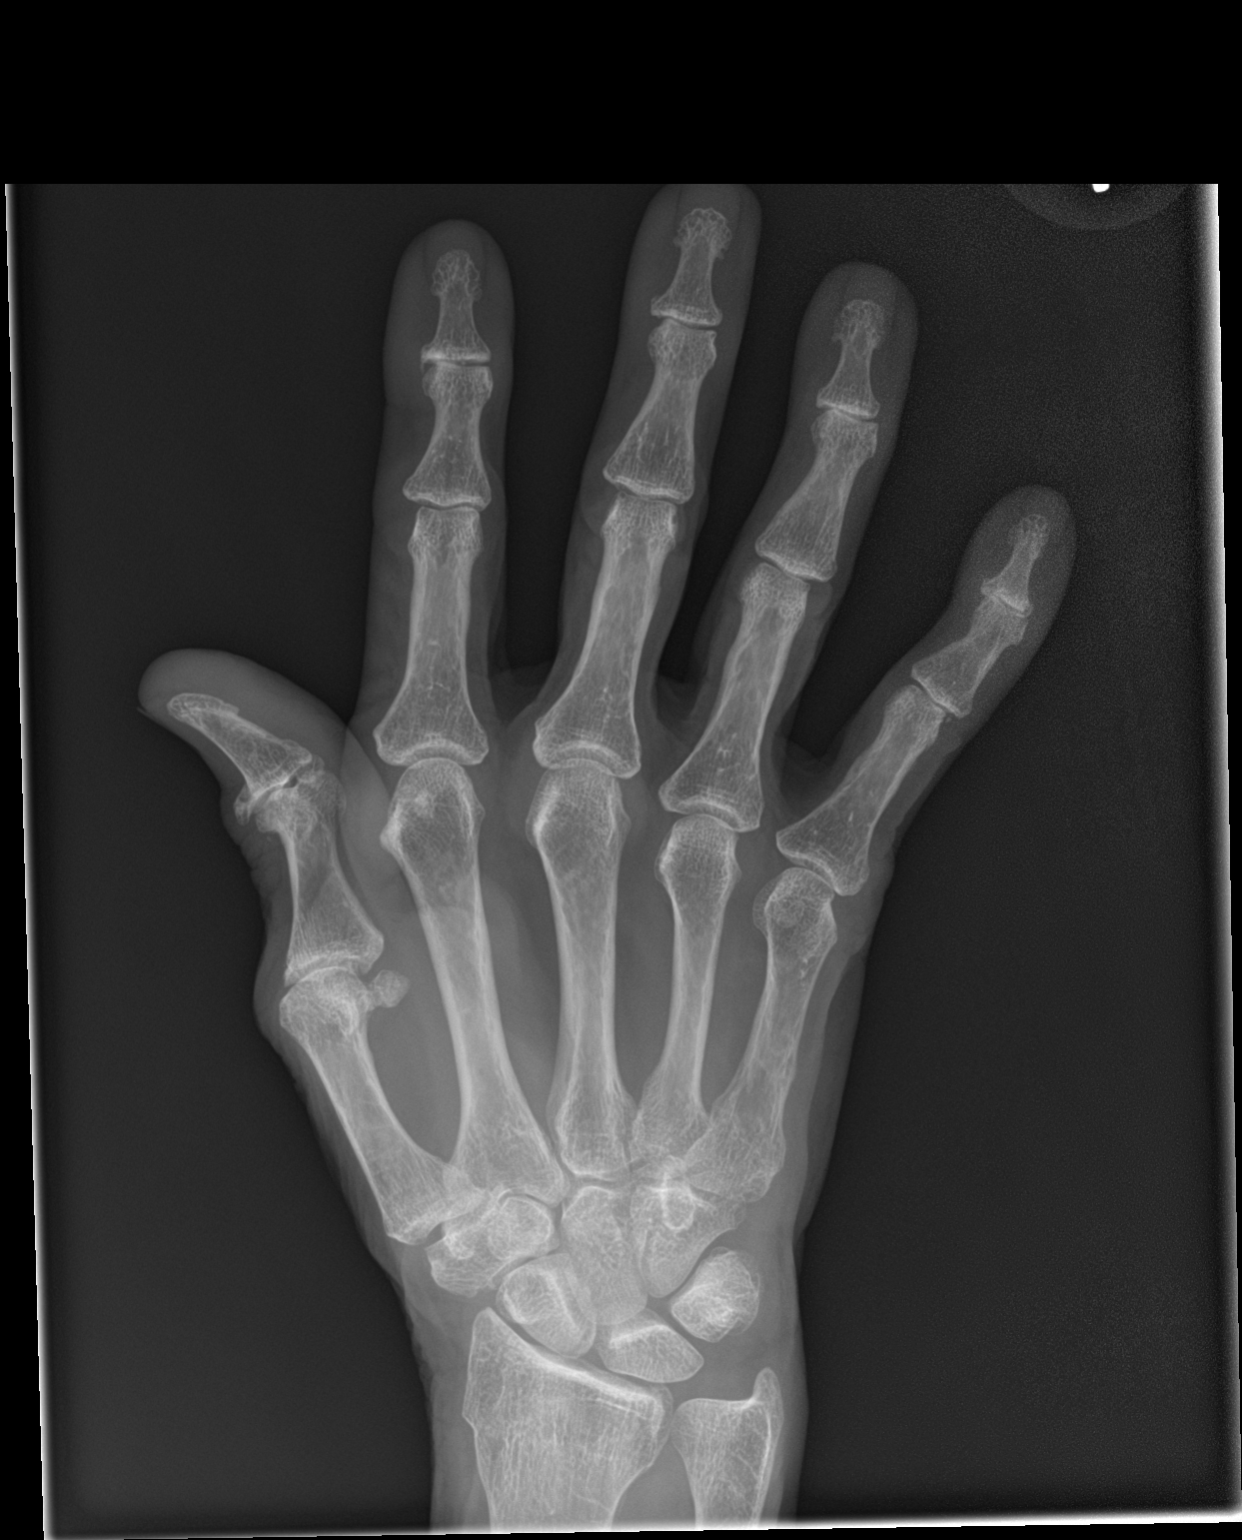

[hand obl]
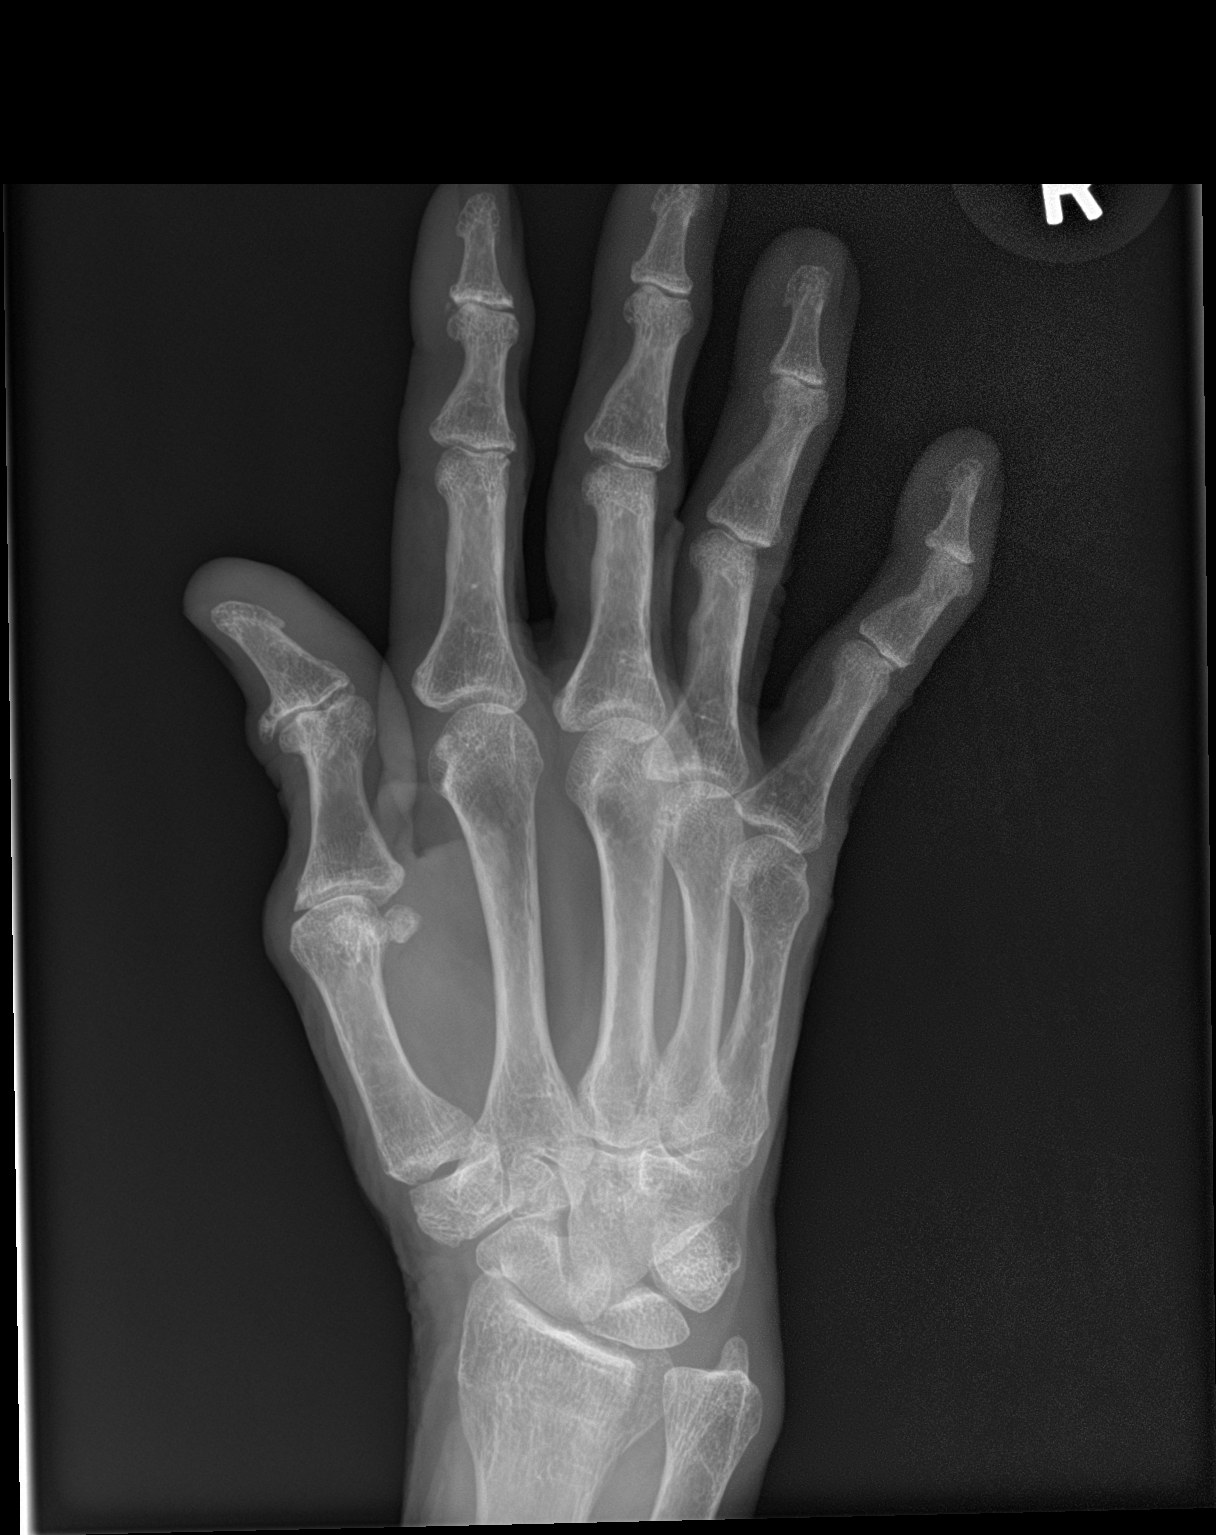

[hand lat]
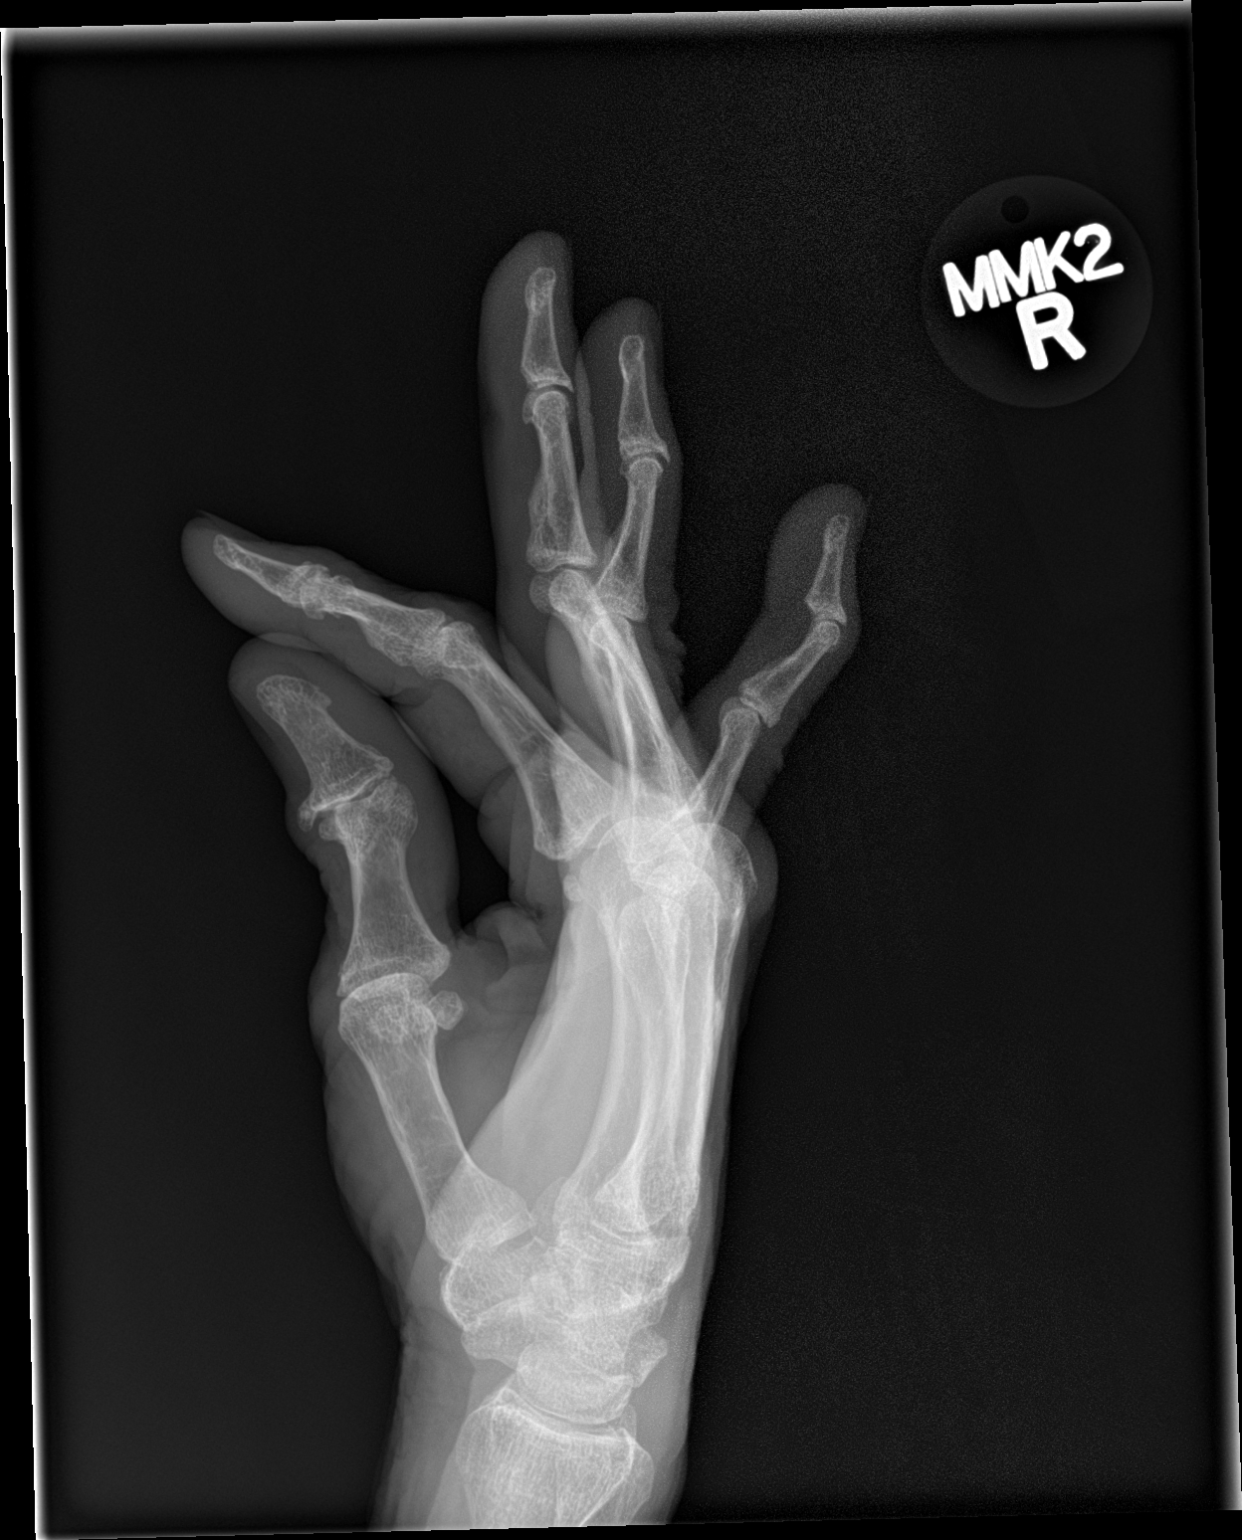

[3 of 3 positions shown; findings below may reference images not displayed]

FINDINGS: The previously demonstrated nondisplaced right transverse fracture
of the distal radial metaphysis is faintly visualized. No additional
fractures and no dislocation. First IP joint and second, third and
fourth DIP joint degenerative changes.
IMPRESSION: 1. Nondisplaced distal radial metaphysis fracture.
2. Changes of osteoarthritis involving the first through fourth
digits.

## 2018-04-28 IMAGING — CR DG WRIST COMPLETE 3+V*R*
4 series · 4 of 4 positions shown · non-contrast
Comparison: Right hand and forearm obtained at the same time.

CLINICAL DATA: Right wrist pain following a fall today.

EXAM:
RIGHT WRIST - COMPLETE 3+ VIEW

[wrist pa]
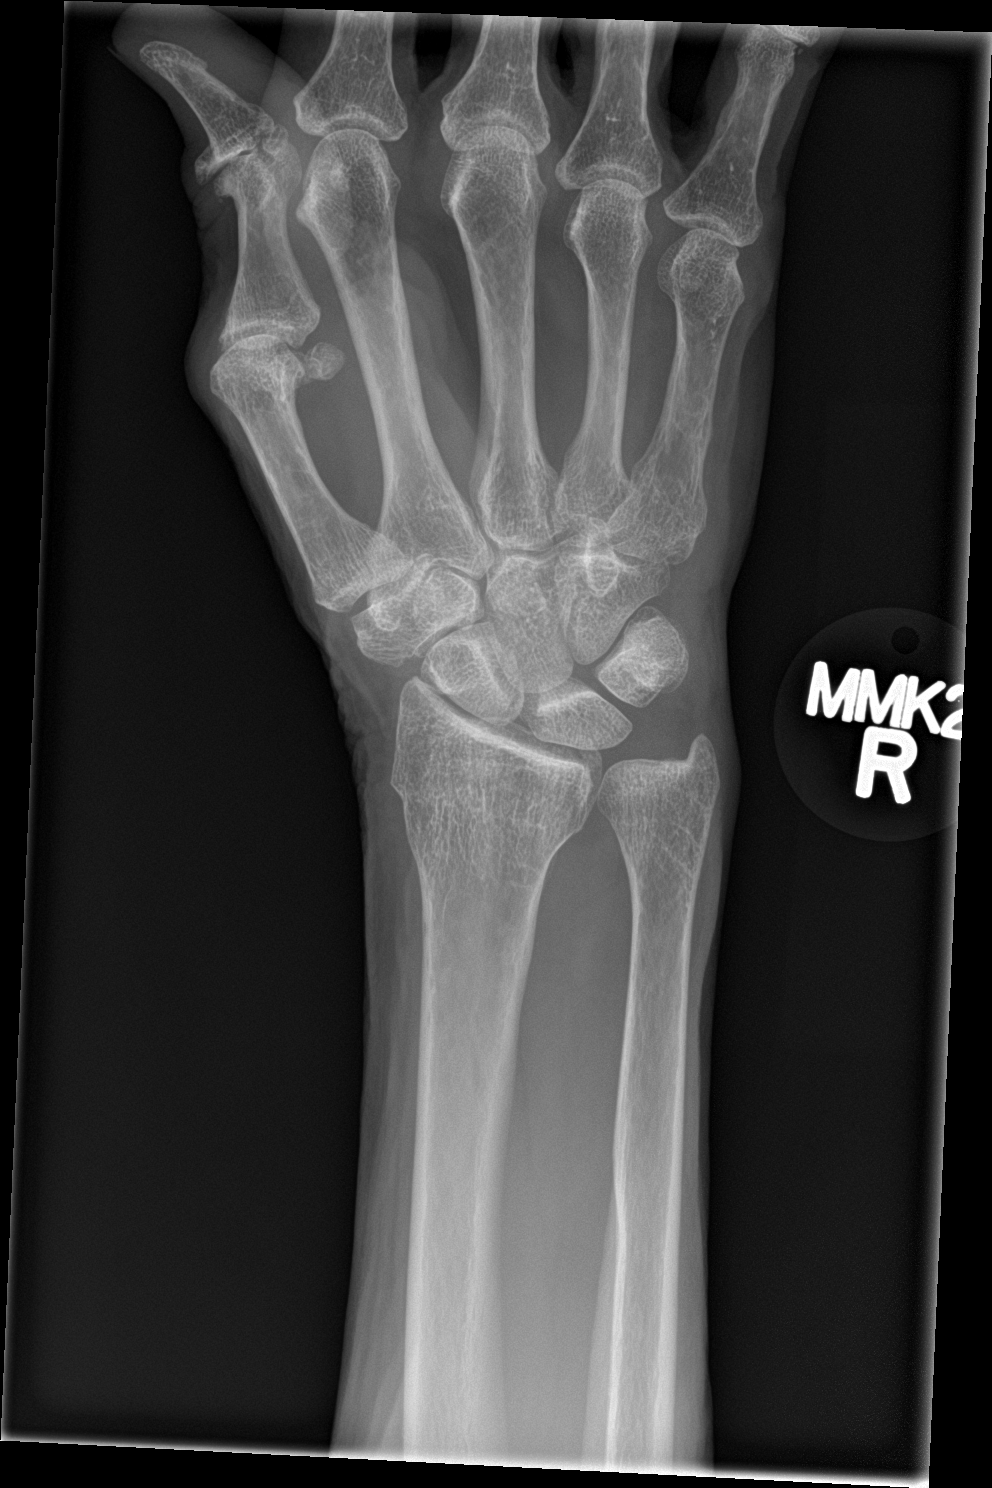

[wrist obl]
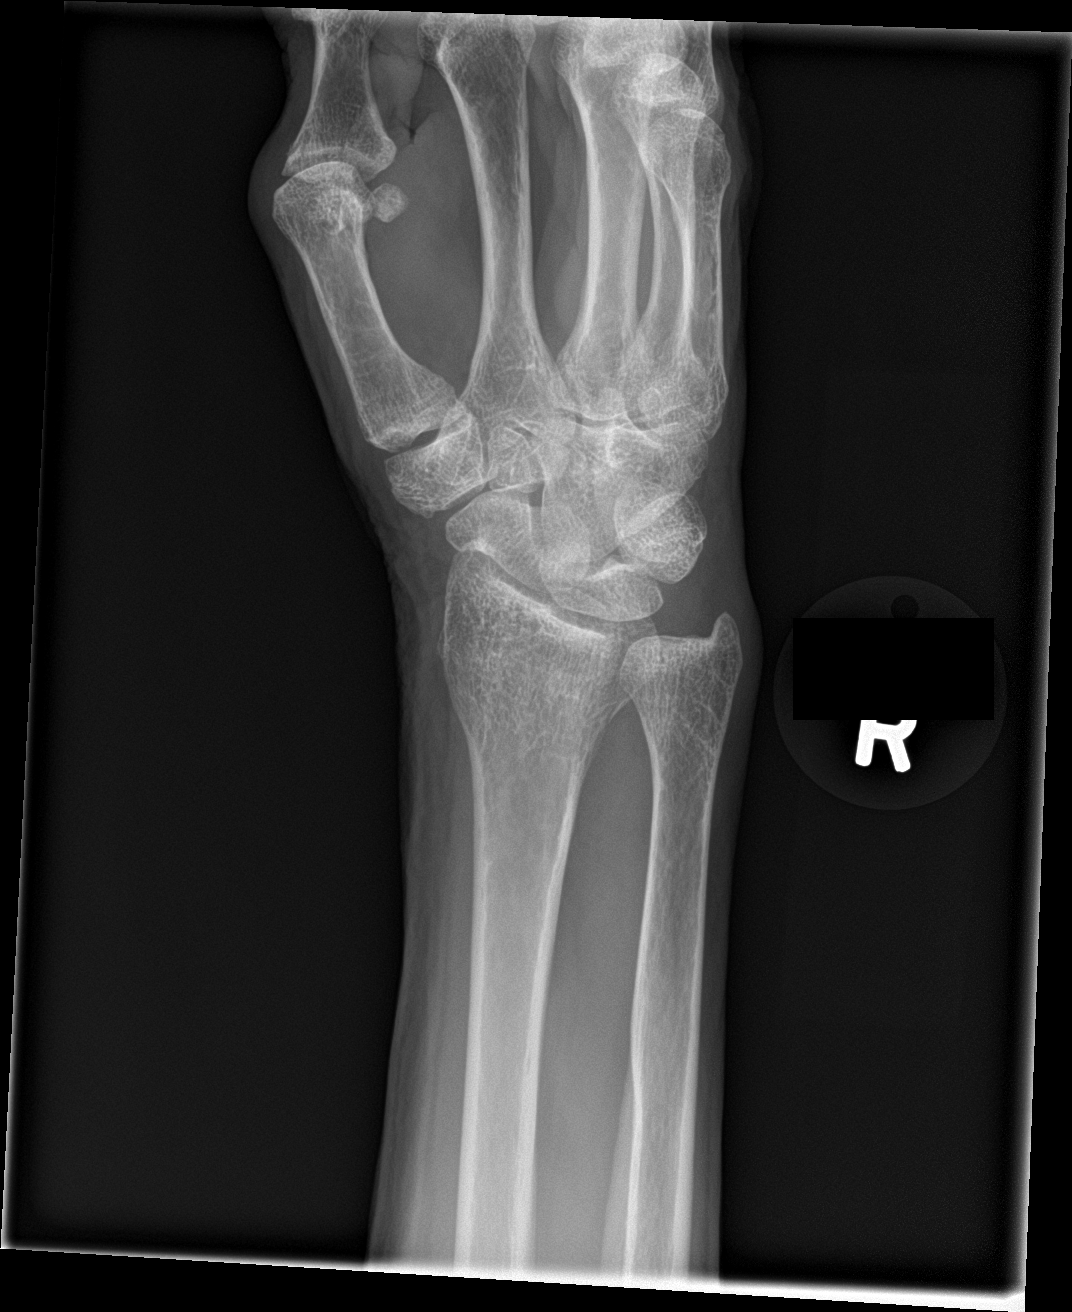

[wrist lat]
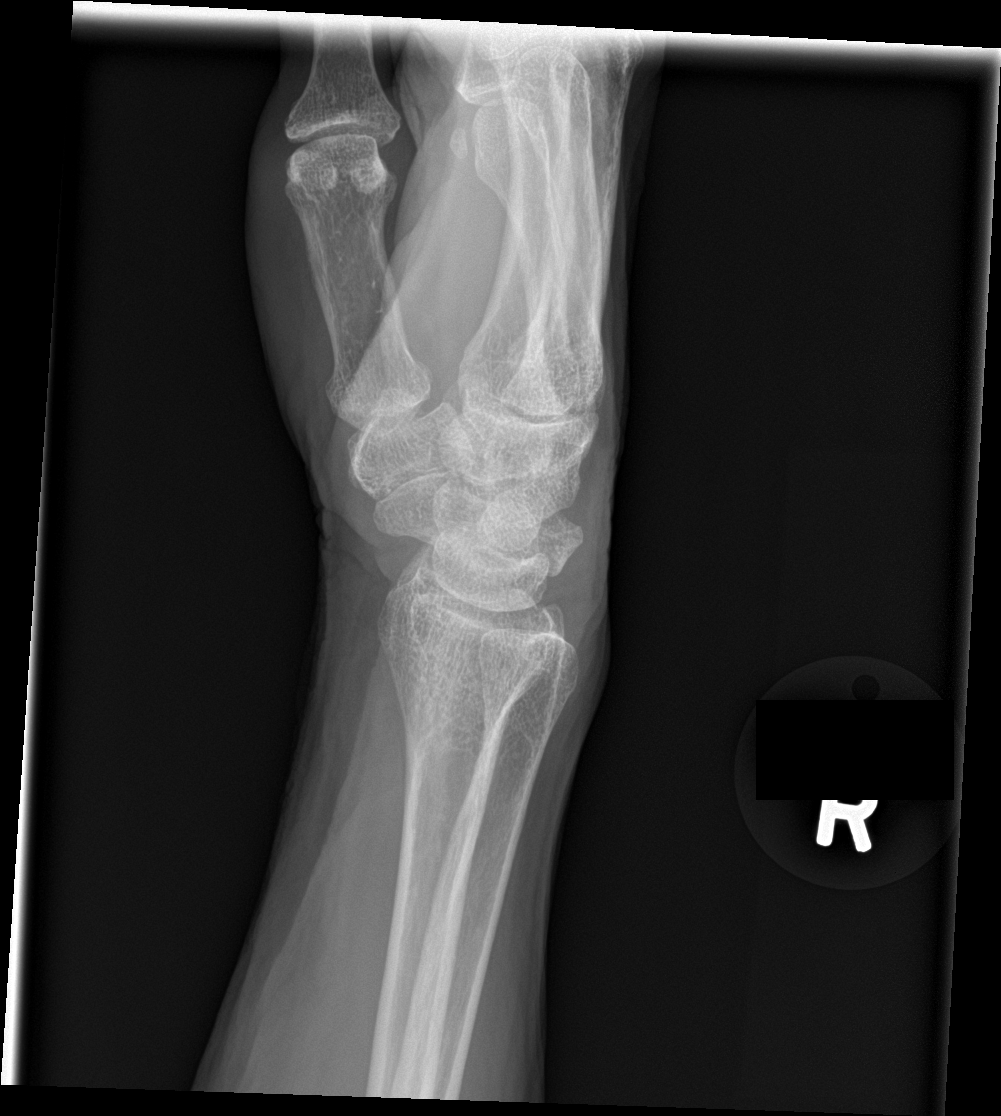

[wrist navicular]
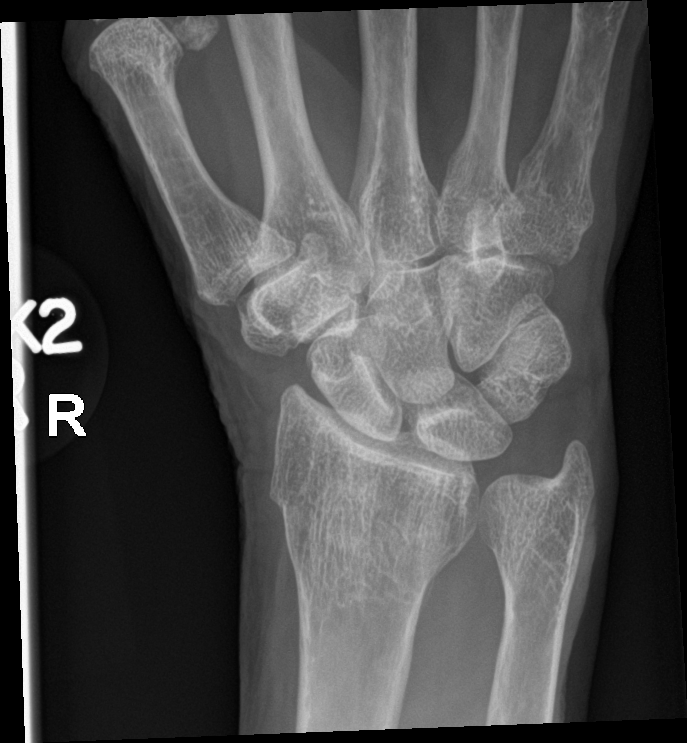

[4 of 4 positions shown; findings below may reference images not displayed]

FINDINGS: Transverse fracture of the distal radial metaphysis without
displacement or angulation. No other fractures are seen. First IP
joint degenerative changes.
IMPRESSION: Nondisplaced fracture of the distal radial metaphysis.

## 2018-05-09 DIAGNOSIS — H40053 Ocular hypertension, bilateral: Secondary | ICD-10-CM | POA: Diagnosis not present

## 2018-05-25 DIAGNOSIS — M810 Age-related osteoporosis without current pathological fracture: Secondary | ICD-10-CM | POA: Diagnosis not present

## 2018-06-23 ENCOUNTER — Other Ambulatory Visit: Payer: Self-pay | Admitting: Gastroenterology

## 2018-06-23 DIAGNOSIS — K589 Irritable bowel syndrome without diarrhea: Secondary | ICD-10-CM

## 2018-06-23 DIAGNOSIS — K219 Gastro-esophageal reflux disease without esophagitis: Secondary | ICD-10-CM

## 2018-06-23 DIAGNOSIS — E611 Iron deficiency: Secondary | ICD-10-CM

## 2018-06-30 DIAGNOSIS — Z139 Encounter for screening, unspecified: Secondary | ICD-10-CM | POA: Diagnosis not present

## 2018-06-30 DIAGNOSIS — M810 Age-related osteoporosis without current pathological fracture: Secondary | ICD-10-CM | POA: Diagnosis not present

## 2018-06-30 DIAGNOSIS — Z23 Encounter for immunization: Secondary | ICD-10-CM | POA: Diagnosis not present

## 2018-06-30 DIAGNOSIS — G20 Parkinson's disease: Secondary | ICD-10-CM | POA: Diagnosis not present

## 2018-06-30 DIAGNOSIS — Z0000 Encounter for general adult medical examination without abnormal findings: Secondary | ICD-10-CM | POA: Diagnosis not present

## 2018-06-30 DIAGNOSIS — E785 Hyperlipidemia, unspecified: Secondary | ICD-10-CM | POA: Diagnosis not present

## 2018-06-30 DIAGNOSIS — R413 Other amnesia: Secondary | ICD-10-CM | POA: Diagnosis not present

## 2018-06-30 DIAGNOSIS — E039 Hypothyroidism, unspecified: Secondary | ICD-10-CM | POA: Diagnosis not present

## 2018-07-11 DIAGNOSIS — H40053 Ocular hypertension, bilateral: Secondary | ICD-10-CM | POA: Diagnosis not present

## 2018-07-30 ENCOUNTER — Other Ambulatory Visit: Payer: Self-pay | Admitting: Gastroenterology

## 2018-07-30 DIAGNOSIS — K589 Irritable bowel syndrome without diarrhea: Secondary | ICD-10-CM

## 2018-07-30 DIAGNOSIS — E611 Iron deficiency: Secondary | ICD-10-CM

## 2018-07-30 DIAGNOSIS — K219 Gastro-esophageal reflux disease without esophagitis: Secondary | ICD-10-CM

## 2018-09-07 ENCOUNTER — Other Ambulatory Visit: Payer: Self-pay

## 2018-09-07 DIAGNOSIS — G249 Dystonia, unspecified: Secondary | ICD-10-CM

## 2018-09-07 DIAGNOSIS — G20 Parkinson's disease: Secondary | ICD-10-CM

## 2018-09-07 MED ORDER — PRAMIPEXOLE DIHYDROCHLORIDE 1.5 MG PO TABS: ORAL_TABLET | ORAL | 3 refills | Status: AC

## 2018-09-07 MED ORDER — CARBIDOPA-LEVODOPA 25-100 MG PO TABS: 1.0000 | ORAL_TABLET | Freq: Three times a day (TID) | ORAL | 3 refills | Status: AC

## 2018-10-11 DIAGNOSIS — G20 Parkinson's disease: Secondary | ICD-10-CM | POA: Diagnosis not present

## 2018-10-11 DIAGNOSIS — Z681 Body mass index (BMI) 19 or less, adult: Secondary | ICD-10-CM | POA: Diagnosis not present

## 2018-10-11 DIAGNOSIS — E785 Hyperlipidemia, unspecified: Secondary | ICD-10-CM | POA: Diagnosis not present

## 2018-10-11 DIAGNOSIS — M810 Age-related osteoporosis without current pathological fracture: Secondary | ICD-10-CM | POA: Diagnosis not present

## 2018-10-11 DIAGNOSIS — K219 Gastro-esophageal reflux disease without esophagitis: Secondary | ICD-10-CM | POA: Diagnosis not present

## 2018-10-11 DIAGNOSIS — E039 Hypothyroidism, unspecified: Secondary | ICD-10-CM | POA: Diagnosis not present

## 2018-10-11 DIAGNOSIS — E559 Vitamin D deficiency, unspecified: Secondary | ICD-10-CM | POA: Diagnosis not present

## 2018-10-11 DIAGNOSIS — Z1331 Encounter for screening for depression: Secondary | ICD-10-CM | POA: Diagnosis not present

## 2018-10-11 DIAGNOSIS — D539 Nutritional anemia, unspecified: Secondary | ICD-10-CM | POA: Diagnosis not present

## 2018-10-26 ENCOUNTER — Ambulatory Visit: Payer: Medicare Other | Admitting: Neurology

## 2018-10-26 ENCOUNTER — Telehealth: Payer: Self-pay

## 2018-10-26 NOTE — Telephone Encounter (Signed)
Pt did not show for their appt with Dr. Athar today.  

## 2018-10-27 ENCOUNTER — Encounter: Payer: Self-pay | Admitting: Neurology

## 2018-11-15 DIAGNOSIS — R262 Difficulty in walking, not elsewhere classified: Secondary | ICD-10-CM | POA: Diagnosis not present

## 2019-01-10 DIAGNOSIS — G20 Parkinson's disease: Secondary | ICD-10-CM | POA: Diagnosis not present

## 2019-01-10 DIAGNOSIS — M810 Age-related osteoporosis without current pathological fracture: Secondary | ICD-10-CM | POA: Diagnosis not present

## 2019-01-10 DIAGNOSIS — E785 Hyperlipidemia, unspecified: Secondary | ICD-10-CM | POA: Diagnosis not present

## 2019-01-10 DIAGNOSIS — F329 Major depressive disorder, single episode, unspecified: Secondary | ICD-10-CM | POA: Diagnosis not present

## 2019-01-10 DIAGNOSIS — E559 Vitamin D deficiency, unspecified: Secondary | ICD-10-CM | POA: Diagnosis not present

## 2019-01-10 DIAGNOSIS — D539 Nutritional anemia, unspecified: Secondary | ICD-10-CM | POA: Diagnosis not present

## 2019-01-10 DIAGNOSIS — E039 Hypothyroidism, unspecified: Secondary | ICD-10-CM | POA: Diagnosis not present

## 2019-01-10 DIAGNOSIS — K219 Gastro-esophageal reflux disease without esophagitis: Secondary | ICD-10-CM | POA: Diagnosis not present

## 2019-01-10 DIAGNOSIS — Z681 Body mass index (BMI) 19 or less, adult: Secondary | ICD-10-CM | POA: Diagnosis not present

## 2019-01-13 DIAGNOSIS — M810 Age-related osteoporosis without current pathological fracture: Secondary | ICD-10-CM | POA: Diagnosis not present

## 2019-01-29 ENCOUNTER — Encounter (HOSPITAL_COMMUNITY): Payer: Self-pay | Admitting: Student

## 2019-01-29 ENCOUNTER — Other Ambulatory Visit: Payer: Self-pay

## 2019-01-29 ENCOUNTER — Emergency Department (HOSPITAL_COMMUNITY)
Admission: EM | Admit: 2019-01-29 | Discharge: 2019-01-29 | Disposition: A | Discharge status: Home / Self Care | Payer: Medicare HMO | Attending: Emergency Medicine | Admitting: Emergency Medicine

## 2019-01-29 ENCOUNTER — Emergency Department (HOSPITAL_COMMUNITY): Payer: Medicare HMO

## 2019-01-29 DIAGNOSIS — Z79899 Other long term (current) drug therapy: Secondary | ICD-10-CM | POA: Insufficient documentation

## 2019-01-29 DIAGNOSIS — G20 Parkinson's disease: Secondary | ICD-10-CM | POA: Insufficient documentation

## 2019-01-29 DIAGNOSIS — M25511 Pain in right shoulder: Secondary | ICD-10-CM | POA: Diagnosis not present

## 2019-01-29 DIAGNOSIS — J449 Chronic obstructive pulmonary disease, unspecified: Secondary | ICD-10-CM | POA: Insufficient documentation

## 2019-01-29 DIAGNOSIS — E039 Hypothyroidism, unspecified: Secondary | ICD-10-CM | POA: Insufficient documentation

## 2019-01-29 DIAGNOSIS — W01198A Fall on same level from slipping, tripping and stumbling with subsequent striking against other object, initial encounter: Secondary | ICD-10-CM | POA: Diagnosis not present

## 2019-01-29 DIAGNOSIS — Y939 Activity, unspecified: Secondary | ICD-10-CM | POA: Insufficient documentation

## 2019-01-29 DIAGNOSIS — Y999 Unspecified external cause status: Secondary | ICD-10-CM | POA: Diagnosis not present

## 2019-01-29 DIAGNOSIS — S42294A Other nondisplaced fracture of upper end of right humerus, initial encounter for closed fracture: Secondary | ICD-10-CM | POA: Diagnosis not present

## 2019-01-29 DIAGNOSIS — W19XXXA Unspecified fall, initial encounter: Secondary | ICD-10-CM

## 2019-01-29 DIAGNOSIS — S59901A Unspecified injury of right elbow, initial encounter: Secondary | ICD-10-CM | POA: Diagnosis not present

## 2019-01-29 DIAGNOSIS — Y92010 Kitchen of single-family (private) house as the place of occurrence of the external cause: Secondary | ICD-10-CM | POA: Diagnosis not present

## 2019-01-29 DIAGNOSIS — S42201A Unspecified fracture of upper end of right humerus, initial encounter for closed fracture: Secondary | ICD-10-CM | POA: Insufficient documentation

## 2019-01-29 DIAGNOSIS — S4991XA Unspecified injury of right shoulder and upper arm, initial encounter: Secondary | ICD-10-CM | POA: Diagnosis present

## 2019-01-29 DIAGNOSIS — R52 Pain, unspecified: Secondary | ICD-10-CM

## 2019-01-29 DIAGNOSIS — S3992XA Unspecified injury of lower back, initial encounter: Secondary | ICD-10-CM | POA: Diagnosis not present

## 2019-01-29 MED ORDER — OXYCODONE-ACETAMINOPHEN 2.5-325 MG PO TABS: 1.0000 | ORAL_TABLET | Freq: Four times a day (QID) | ORAL | 0 refills | Status: AC | PRN

## 2019-01-29 MED ORDER — OXYCODONE-ACETAMINOPHEN 5-325 MG PO TABS
1.0000 | ORAL_TABLET | Freq: Once | ORAL | Status: AC
  Administered 2019-01-29: 1 via ORAL
  Filled 2019-01-29: qty 1

## 2019-01-29 NOTE — Progress Notes (Signed)
Orthopedic Tech Progress Note Patient Details:  Judy Holt 1936/03/15 [id number]  Ortho Devices Type of Ortho Device: Sling immobilizer Ortho Device/Splint Interventions: Adjustment   Post Interventions Patient Tolerated: Well   [place] Pink 01/29/2019, 6:05 PM

## 2019-01-29 NOTE — Discharge Instructions (Addendum)
Please read and follow all provided instructions.  You have been seen today for an injury to the right shoulder.  X-rays showed a proximal humerus fracture.  We had limited use of the spine therefore if you have worsened or localized back pain this will need repeat imaging.  We have placed you in an immobilizer for the humeral fracture, wear this at all times, follow the below instructions and follow-up with orthopedics.  Tests performed today include: An x-ray of the affected area - does NOT show any broken bones or dislocations.  Vital signs. See below for your results today.   Home care instructions: -- *PRICE in the first 24-48 hours after injury: Protect remain in immobilizer at all times  Rest Ice- Do not apply ice pack directly to your skin, place towel or similar between your skin and ice/ice pack. Apply ice for 20 min, then remove for 40 min while awake Compression- wear immobilizer as above.  Elevate - does not apply based on injury.   Medications:  -Percocet-this is a narcotic/controlled substance medication that has potential addicting qualities.  We recommend that you take 1-2 tablets every 6 hours as needed for severe pain.  Do not drive or operate heavy machinery when taking this medicine as it can be sedating. Do not drink alcohol or take other sedating medications when taking this medicine for safety reasons.  Keep this out of reach of small children.  Please be aware this medicine has Tylenol in it (325 mg/tab) do not exceed the maximum dose of Tylenol in a day per over the counter recommendations should you decide to supplement with Tylenol over the counter.    We have prescribed you new medication(s) today. Discuss the medications prescribed today with your pharmacist as they can have adverse effects and interactions with your other medicines including over the counter and prescribed medications. Seek medical evaluation if you start to experience new or abnormal symptoms after  taking one of these medicines, seek care immediately if you start to experience difficulty breathing, feeling of your throat closing, facial swelling, or rash as these could be indications of a more serious allergic reaction   Follow-up instructions: Please follow-up with orthopedics, Dr. Doreatha Martin, within 3-4 days.   Return instructions:  Please return if your digits or extremity are numb or tingling, appear gray or blue, or you have severe pain (also elevate the extremity and loosen splint or wrap if you were given one) Please return if you have redness or fevers.  Please return to the Emergency Department if you experience worsening symptoms.  Please return if you have any other emergent concerns. Additional Information:  Your vital signs today were: BP (!) 137/58 (BP Location: Left Arm)    Pulse 68    Temp 98 F (36.7 C) (Oral)    Resp 18    Ht 4\' 10"  (1.473 m)    Wt 38.6 kg    SpO2 100%    BMI 17.77 kg/m  If your blood pressure (BP) was elevated above 135/85 this visit, please have this repeated by your doctor within one month. ---------------

## 2019-01-29 NOTE — ED Provider Notes (Signed)
[facility] Provider Note   CSN: [id number] Arrival date & time: 01/29/19  1326     History   Chief Complaint Chief Complaint  Patient presents with  . Fall    HPI Judy Holt is a 83 y.o. female with a hx of anemia, hyperlipidemia, anxiety, migraines, hypothyroidism, & parkinson's disease w/ gait abnormality who presents to the ED w/ her daughter s/p fall shortly PTA w/ complaints of R shoulder pain. Patient was walking without her walker, miss-stepped, and fell forward hitting her R shoulder on the refrigerator.  She did fall all the way to the ground but did not have any head injury or loss of consciousness.  She was able to get up with assistance and has been ambulatory since.  She is complaining of pain only to the right shoulder that is moderate to severe, worse with attempts to move it, no alleviating factors.  Took morphine at home prior to arrival.  Denies any prodromal lightheadedness, dizziness, chest pain, or shortness of breath. Per patient's daughter she is at her mental status baseline w/ hx of parkinsons- level 5 caveat. She has been ambulatory since the fall.      HPI  Past Medical History:  Diagnosis Date  . Acid reflux disease   . Adenomatous colon polyp 09/2000  . Anemia   . Anxiety   . Chronic obstructive airway disease ([place])   . Depression   . Hiatal hernia   . Hyperlipidemia   . Hypothyroidism   . IBS (irritable bowel syndrome)   . Insomnia   . Memory loss   . Migraines   . Osteoporosis   . Parkinson's disease ([place])   . Unspecified vitamin D deficiency 11/07/2012    Patient Active Problem List   Diagnosis Date Noted  . Gait abnormality 06/23/2017  . Memory loss 06/19/2015  . Dizziness 06/10/2015  . Vitamin D deficiency 11/07/2012  . Depression   . Anxiety   . Migraines   . Parkinson's disease ([place])   . Hypothyroidism   . Acid reflux disease   . Depressive disorder, not elsewhere classified  10/08/2012  . Osteoporosis, unspecified 10/08/2012  . Migraine, unspecified, without mention of intractable migraine without mention of status migrainosus 10/08/2012  . Unspecified hypothyroidism 10/08/2012  . Parkinson's disease 10/08/2012    Past Surgical History:  Procedure Laterality Date  . ABDOMINAL HYSTERECTOMY    . bladder  09/2013   bladder tac  . left arm surgery  2004     OB History   No obstetric history on file.      Home Medications    Prior to Admission medications   Medication Sig Start Date End Date Taking? Authorizing Provider  ALPRAZolam Duanne Moron) 0.5 MG tablet Take 0.5 mg by mouth 3 (three) times daily as needed for sleep.    [provider]  butalbital-acetaminophen-caffeine (FIORICET, ESGIC) 50-325-40 MG per tablet Take 1 tablet by mouth 2 (two) times daily as needed for headache.    [provider]  carbidopa-levodopa (SINEMET IR) 25-100 MG tablet Take 1 tablet by mouth 3 (three) times daily. 09/07/18   Star Age, MD  denosumab (PROLIA) 60 MG/ML SOLN injection Inject 60 mg into the skin every 6 (six) months. Administer in upper arm, thigh, or abdomen    [provider]  dicyclomine (BENTYL) 10 MG capsule TAKE 1 CAPSULE BY MOUTH 4  TIMES DAILY - BEFORE MEALS  AND AT BEDTIME. 02/09/17   Ladene Artist, MD  FLUoxetine (PROZAC) 40 MG capsule  05/13/16   [provider]  fluticasone Asencion Islam) 50 MCG/ACT nasal spray  03/16/16   [provider]  Iron-FA-B Cmp-C-Biot-Probiotic (FUSION PLUS) CAPS  02/09/15   [provider]  levothyroxine (SYNTHROID, LEVOTHROID) 75 MCG tablet 75 mcg daily. 03/30/18   [provider]  mirtazapine (REMERON) 15 MG tablet  05/26/16   [provider]  omeprazole (PRILOSEC) 40 MG capsule TAKE 1 CAPSULE BY MOUTH TWO TIMES DAILY 12/23/17   Ladene Artist, MD  oxybutynin (DITROPAN) 5 MG tablet Take 5 mg by mouth as directed.    [provider]  pramipexole  (MIRAPEX) 1.5 MG tablet TAKE ONE-HALF TABLET BY MOUTH 3 TIMES A DAY, 9 AM, 1 PM, AND 8 PM 09/07/18   Star Age, MD    Family History Family History  Problem Relation Age of Onset  . Alzheimer's disease Mother   . Stroke Mother   . Cancer Father   . Colon cancer Neg Hx     Social History Social History   Tobacco Use  . Smoking status: Never Smoker  . Smokeless tobacco: Never Used  Substance Use Topics  . Alcohol use: No    Alcohol/week: 0.0 standard drinks  . Drug use: No     Allergies   Ciprofloxacin, Cymbalta [duloxetine hcl], Nitrofurantoin macrocrystal, Sulfa antibiotics, Codeine, Macrobid [nitrofurantoin], Mobic [meloxicam], and Phenergan [promethazine]   Review of Systems Review of Systems  Unable to perform ROS: Dementia     Physical Exam Updated Vital Signs BP (!) 137/58 (BP Location: Left Arm)   Pulse 68   Temp 98 F (36.7 C) (Oral)   Resp 18   Ht 4\' 10"  (1.473 m)   Wt 38.6 kg   SpO2 100%   BMI 17.77 kg/m   Physical Exam Vitals signs and nursing note reviewed.  Constitutional:      General: She is not in acute distress.    Appearance: She is well-developed.  HENT:     Head: Normocephalic and atraumatic. No raccoon eyes or Battle's sign.     Right Ear: No hemotympanum.     Left Ear: No hemotympanum.     Nose: Nose normal.     Mouth/Throat:     Mouth: Mucous membranes are moist.     Pharynx: Uvula midline.  Eyes:     General:        Right eye: No discharge.        Left eye: No discharge.     Extraocular Movements: Extraocular movements intact.     Conjunctiva/sclera: Conjunctivae normal.     Pupils: Pupils are equal, round, and reactive to light.  Neck:     Musculoskeletal: Normal range of motion. No spinous process tenderness.  Cardiovascular:     Rate and Rhythm: Normal rate and regular rhythm.     Pulses:          Radial pulses are 2+ on the right side and 2+ on the left side.       Dorsalis pedis pulses are 2+ on the right side and  2+ on the left side.     Heart sounds: No murmur.  Pulmonary:     Effort: No respiratory distress.     Breath sounds: Normal breath sounds. No wheezing or rales.  Chest:     Chest wall: No deformity, tenderness or crepitus.  Abdominal:     General: There is no distension.     Palpations: Abdomen is soft.  Tenderness: There is no abdominal tenderness. There is no guarding or rebound.  Musculoskeletal:     Comments: UEs: Swelling noted to R shoulder. No obvious deformity, erythema, ecchymosis, or open wounds. Intact AROM throughout with the exception of R shoulder being substantially limited equivocal to patient pain, able to move somewhat in extension/flexion. E elbow flexion somewhat limited, able to flex to about 90 degrees, able to fully extend. Equivocal to pain. Otherwise normal ROM. Tender to palpation to diffuse R GH joint, the scapular spine, the humeral head, distal 1/3rd of the humerus, & the olecranon of the RUE. UEs are otherwise nontender. NVI distally Back: Patient tender to the upper to mid thoracic spine without point/focal tenderness or palpable step off. No cervical, lumbar, sacral, or coccygeal tenderness.  LEs: Normal AROM. Nontender.   Skin:    General: Skin is warm and dry.     Findings: No rash.  Neurological:     Comments: Alert. Clear speech. CN III-XII grossly intact. Sensation grossly intact x 4. 5/5 symmetric grip strength. 5/5 strength with plantar/dorsiflexion bilaterally. Tremulous which patient & family report consistent w/ her parkinsons.   Psychiatric:        Behavior: Behavior normal.      ED Treatments / Results  Labs (all labs ordered are listed, but only abnormal results are displayed) Labs Reviewed - No data to display  EKG    Radiology Dg Shoulder Right  Result Date: 01/29/2019 CLINICAL DATA:  Pt states she has parkinsons and tripped and fell into the fridge today. Pt having right shoulder pain. Unable to move right arm at all. EXAM:  RIGHT SHOULDER - 2+ VIEW COMPARISON:  None. FINDINGS: There is a comminuted nondisplaced fracture of the proximal humerus. A transverse fracture line extends across the metaphysis with an oblique fracture line extending inferiorly to the proximal diaphysis. Fracture lines across the greater tuberosity. No other fractures. The glenohumeral and AC joints are normally aligned. There is adjacent soft tissue swelling. IMPRESSION: Comminuted, nondisplaced fracture of the proximal right humerus as described. No dislocation. Electronically Signed   By: Lajean Manes M.D.   On: 01/29/2019 15:37   Dg Elbow Complete Right  Result Date: 01/29/2019 CLINICAL DATA:  Pt states she has parkinsons and tripped and fell into the fridge today. Pt having right shoulder pain. Unable to move right arm at all. EXAM: RIGHT ELBOW - COMPLETE 3+ VIEW COMPARISON:  None. FINDINGS: No fracture or bone lesion. Elbow joint is normally spaced and aligned. No arthropathic changes. No joint effusion. Soft tissues are unremarkable. IMPRESSION: Negative. Electronically Signed   By: Lajean Manes M.D.   On: 01/29/2019 15:35   Dg Thoracic Spine 1 View  Result Date: 01/29/2019 CLINICAL DATA:  Pt states she has parkinsons and tripped and fell into the fridge today. Pt having right shoulder pain. Unable to move right arm at all. EXAM: EXAM THORACIC SPINE - 1 VIEW COMPARISON:  Chest radiograph, 11/14/2012 FINDINGS: No fracture. No bone lesion. Discs are relatively well maintained in height. The soft tissues are unremarkable. IMPRESSION: 1. No fracture or bone lesion. Study limited being a single AP view only. Electronically Signed   By: Lajean Manes M.D.   On: 01/29/2019 15:34   Dg Humerus Right  Result Date: 01/29/2019 CLINICAL DATA:  Pt states she has parkinsons and tripped and fell into the fridge today. Pt having right shoulder pain. Unable to move right arm at all. EXAM: RIGHT HUMERUS - 2+ VIEW COMPARISON:  None. FINDINGS: There  is a fracture  of the proximal right humeral metaphysis. Fracture is nondisplaced. There is mild fracture comminution extending across the base of the greater tuberosity. No other fractures. The glenohumeral and AC joints are normally aligned. There is soft tissue swelling adjacent to the fracture proximal right humerus. IMPRESSION: 1. Nondisplaced, mildly comminuted fracture of the proximal right humerus. No dislocation. Electronically Signed   By: Lajean Manes M.D.   On: 01/29/2019 15:36    Procedures Procedures (including critical care time) SPLINT APPLICATION Date/Time: 6:37 PM Authorized by: Kennith Maes Consent: Verbal consent obtained. Risks and benefits: risks, benefits and alternatives were discussed Consent given by: patient Splint applied by: tech/nursing staff Location details: RUE Splint type: sling/immobilizer Supplies used: sling/immobilizer Post-procedure: The splinted body part was neurovascularly unchanged following the procedure. Patient tolerance: Patient tolerated the procedure well with no immediate complications.  Medications Ordered in ED Medications  oxyCODONE-acetaminophen (PERCOCET/ROXICET) 5-325 MG per tablet 1 tablet (1 tablet Oral Given 01/29/19 1418)    Initial Impression / Assessment and Plan / ED Course  I have reviewed the triage vital signs and the nursing notes.  Pertinent labs & imaging results that were available during my care of the patient were reviewed by me and considered in my medical decision making (see chart for details).   Patient presents to the ED w/ her daughter s/p mechanical fall, no prodromal sxs, hx of parkinsons w/ gait abnormality, currently has complaints of R shoulder pain. Nontoxic appearing, vitals without significant abnormality. Denies head injury, no acute mental status changes w/o focal acute neuro deficits. R shoulder/elbow ROM limited secondary to pain w/ tenderness to palpation, NVI distally. Some diffuse upper to mid thoracic  spine tenderness to palpation, no point/focal tenderness or step off noted, cervical/lumbar/sacral/coccygeal spine nontender. No chest/abdominal/pelvis tenderness. Will obtain x-rays and order pain medications.   Imaging notable for nondisplaced, mildly comminuted fracture of the proximal right humerus. No dislocation. NVI distally. Will place in sling immobilizer & have patient follow up w/ orthopedics for this per discussion w/ Dr. Darl Householder supervisor at change of shift.   T spine xray views limited secondary to patient positioning related to pain in the right shoulder- on re-assessment she is feeling much better after percocet, no point/focal vertebral tenderness or step off, no neuro deficits, ambulatory- do not feel significant thoracic spine fx is likely, I discussed option of CT imaging w/ patient & her daughter, they have declined this which I feel is reasonable. We discussed should she have increased back pain or localization to a certain area of the back or neuro changes she should return to the ER or see PCP/ortho outpatient for further imaging.   Patient has been ambulatory s/p fall. Feeling much better following percocet. Will discharge home in immobilizer w/ short course of low dose percocet.  [place] Controlled Substance reporting System queried. I discussed results, treatment plan, need for follow-up, and return precautions with the patient & her daughter. Provided opportunity for questions, patient & her daughter  are in agreement with plan.   Findings and plan of care discussed with supervising physician Dr. Jeanell Sparrow who is in agreement.   Final Clinical Impressions(s) / ED Diagnoses   Final diagnoses:  Closed fracture of proximal end of right humerus, unspecified fracture morphology, initial encounter  Fall, initial encounter    ED Discharge Orders         Ordered    oxycodone-acetaminophen (PERCOCET) 2.5-325 MG tablet  Every 6 hours PRN     01/29/19  [address] 01/29/19 1626    Pattricia Boss, MD 01/29/19 1630

## 2019-01-29 NOTE — ED Triage Notes (Signed)
PT states she has parkinsons and tripped and fell into the fridge.  No loc or dizziness.  R shoulder swollen.

## 2019-01-29 NOTE — ED Notes (Signed)
Ortho paged x 2. 

## 2019-02-01 ENCOUNTER — Other Ambulatory Visit: Payer: Self-pay

## 2019-02-01 ENCOUNTER — Encounter: Payer: Self-pay | Admitting: Orthopedic Surgery

## 2019-02-01 ENCOUNTER — Ambulatory Visit (INDEPENDENT_AMBULATORY_CARE_PROVIDER_SITE_OTHER): Payer: Medicare HMO | Admitting: Orthopedic Surgery

## 2019-02-01 DIAGNOSIS — S42201A Unspecified fracture of upper end of right humerus, initial encounter for closed fracture: Secondary | ICD-10-CM | POA: Diagnosis not present

## 2019-02-01 NOTE — Progress Notes (Signed)
Office Visit Note   Patient: Judy Holt           Date of Birth: [birth year]           MRN: [id number] Visit Date: 02/01/2019 Requested by: Lowella Dandy, NP [place],   [id number] PCP: Lowella Dandy, NP  Subjective: Chief Complaint  Patient presents with  . Right Shoulder - Pain    HPI: Judy Holt is an 83 year old patient who injured her right shoulder.  Date of injury 01/29/2019.  She lost her balance and fell into the fridge rater at home.  She has a diagnosis of osteoporosis as well as Parkinson's disease.  She is right-hand dominant.  Outside radiographs are reviewed and it does show a mildly displaced proximal humerus fracture.  She does have pain with movement.  Denies any other orthopedic complaints.  She is here with her daughter who is doing most of the communicating for her.              ROS: All systems reviewed are negative as they relate to the chief complaint within the history of present illness.  Patient denies  fevers or chills.   Assessment & Plan: Visit Diagnoses:  1. Closed fracture of proximal end of right humerus, unspecified fracture morphology, initial encounter     Plan: Impression is right proximal humerus fracture minimally displaced.  Plan is sling immobilization for another 2 and half weeks then begin range of motion exercises after clinical reexamination.  I think in general as long as this fracture stays reasonably approximated she will have acceptable function.  Do not see a surgical indication for this at this time.  I will see her back in 2 and half weeks for repeat clinical check and repeat radiographs.  Follow-Up Instructions: No follow-ups on file.   Orders:  No orders of the defined types were placed in this encounter.  No orders of the defined types were placed in this encounter.     Procedures: No procedures performed   Clinical Data: No additional findings.  Objective: Vital Signs: There were no vitals  taken for this visit.  Physical Exam:   Constitutional: Patient appears well-developed HEENT:  Head: Normocephalic Eyes:EOM are normal Neck: Normal range of motion Cardiovascular: Normal rate Pulmonary/chest: Effort normal Neurologic: Patient is alert Skin: Skin is warm Psychiatric: Patient has normal mood and affect    Ortho Exam: Ortho exam demonstrates palpable radial pulse on the right with expected amount of swelling ecchymosis and bruising in the shoulder.  The axillary nerve is functional.  No crepitus with elbow range of motion.  Outside radiographs of the proximal humerus show mildly displaced fracture.  Shoulder is located.  Specialty Comments:  No specialty comments available.  Imaging: No results found.   PMFS History: Patient Active Problem List   Diagnosis Date Noted  . Gait abnormality 06/23/2017  . Memory loss 06/19/2015  . Dizziness 06/10/2015  . Vitamin D deficiency 11/07/2012  . Depression   . Anxiety   . Migraines   . Parkinson's disease ([place])   . Hypothyroidism   . Acid reflux disease   . Depressive disorder, not elsewhere classified 10/08/2012  . Osteoporosis, unspecified 10/08/2012  . Migraine, unspecified, without mention of intractable migraine without mention of status migrainosus 10/08/2012  . Unspecified hypothyroidism 10/08/2012  . Parkinson's disease 10/08/2012   Past Medical History:  Diagnosis Date  . Acid reflux disease   . Adenomatous colon polyp 09/2000  .  Anemia   . Anxiety   . Chronic obstructive airway disease ([place])   . Depression   . Hiatal hernia   . Hyperlipidemia   . Hypothyroidism   . IBS (irritable bowel syndrome)   . Insomnia   . Memory loss   . Migraines   . Osteoporosis   . Parkinson's disease ([place])   . Unspecified vitamin D deficiency 11/07/2012    Family History  Problem Relation Age of Onset  . Alzheimer's disease Mother   . Stroke Mother   . Cancer Father   . Colon cancer Neg Hx     Past Surgical  History:  Procedure Laterality Date  . ABDOMINAL HYSTERECTOMY    . bladder  09/2013   bladder tac  . left arm surgery  2004   Social History   Occupational History  . Occupation: Retired  Tobacco Use  . Smoking status: Never Smoker  . Smokeless tobacco: Never Used  Substance and Sexual Activity  . Alcohol use: No    Alcohol/week: 0.0 standard drinks  . Drug use: No  . Sexual activity: Not on file

## 2019-02-13 ENCOUNTER — Ambulatory Visit: Payer: Medicare HMO | Admitting: Neurology

## 2019-03-01 ENCOUNTER — Ambulatory Visit: Payer: Medicare HMO | Admitting: Orthopedic Surgery

## 2019-03-16 ENCOUNTER — Ambulatory Visit (INDEPENDENT_AMBULATORY_CARE_PROVIDER_SITE_OTHER): Payer: Medicare HMO

## 2019-03-16 ENCOUNTER — Encounter: Payer: Self-pay | Admitting: Orthopaedic Surgery

## 2019-03-16 ENCOUNTER — Other Ambulatory Visit: Payer: Self-pay

## 2019-03-16 ENCOUNTER — Ambulatory Visit (INDEPENDENT_AMBULATORY_CARE_PROVIDER_SITE_OTHER): Payer: Medicare HMO | Admitting: Orthopaedic Surgery

## 2019-03-16 DIAGNOSIS — S42201D Unspecified fracture of upper end of right humerus, subsequent encounter for fracture with routine healing: Secondary | ICD-10-CM

## 2019-03-16 NOTE — Progress Notes (Signed)
The patient is a very pleasant 83 year old female that is following up with me after sustaining a nondisplaced or minimally displaced right proximal humerus fracture.  This occurred in mid June of this year.  She actually saw my partner Dr. Marlou Sa soon after that injury.  The x-ray showed a nondisplaced fracture and he recommended a sling and then follow-up.  I am seeing her today with her daughter.  She has not been able to keep her mom in her sling much.  She is try to work on range of motion of her right shoulder.  Clinically the shoulder appears located but has limited motion.  There is some pain with attempts of motion.  Her elbow, wrist, and hand all seem to be functioning normally.  X-rays of the right shoulder reviewed today and compared to the previous films.  She has had complete loss of her alignment of her right proximal humerus fracture.  This is changed completely since her injury films showing fracture lines that were nondisplaced.  There is obvious change in the femoral head and significant malalignment.  Functionally, she may do well given her low demand functional status.  However I would like her to follow-up again with Dr. Marlou Sa in 2 weeks with new x-rays of the right shoulder which can be 3 views if she tolerates those x-rays.  She may end up needing some type of shoulder arthroplasty.  Certainly there is a role in this age group with nonoperative treatment.  Her daughter understands this completely and they will follow-up with Dr. Marlou Sa.

## 2019-04-03 ENCOUNTER — Ambulatory Visit (INDEPENDENT_AMBULATORY_CARE_PROVIDER_SITE_OTHER): Payer: Medicare HMO | Admitting: Orthopedic Surgery

## 2019-04-03 ENCOUNTER — Ambulatory Visit (INDEPENDENT_AMBULATORY_CARE_PROVIDER_SITE_OTHER): Payer: Medicare HMO

## 2019-04-03 DIAGNOSIS — S42201D Unspecified fracture of upper end of right humerus, subsequent encounter for fracture with routine healing: Secondary | ICD-10-CM | POA: Diagnosis not present

## 2019-04-05 ENCOUNTER — Encounter: Payer: Self-pay | Admitting: Orthopedic Surgery

## 2019-04-05 NOTE — Progress Notes (Signed)
Post-Op Visit Note   Patient: Judy Holt           Date of Birth: 16-Jan-1936           MRN: [id number] Visit Date: 04/03/2019 PCP: Lowella Dandy, NP   Assessment & Plan:  Chief Complaint:  Chief Complaint  Patient presents with  . Right Shoulder - Follow-up, Fracture   Visit Diagnoses:  1. Closed fracture of proximal end of right humerus with routine healing, unspecified fracture morphology, subsequent encounter     Plan: Judy Holt is a patient with right shoulder proximal humerus fracture.  Date of injury 01/29/2019.  Initially the radiographs showed relatively nondisplaced fracture.  Patient's had multiple falls about 4-5 times a day since that time.  She is right-hand dominant.  Cannot really keep her arm in a sling.  On exam she has only mild pain with passive range of motion of the shoulder but predictably diminished forward flexion and abduction consistent with her nonunion.  I had a long talk with Judy Holt and her daughter.  In general with all the fall she is having I think there is a high likelihood that she could sustain a periprosthetic fracture.  Functionally she is not doing great right now but is not bothering her that much.  I would favor nonoperative treatment for this.  Come back in 8 weeks for final clinical recheck.  I agree with Dr. Trevor Mace assessment that nonoperative treatment would likely be the best course for this patient at this time.  Follow-Up Instructions: No follow-ups on file.   Orders:  Orders Placed This Encounter  Procedures  . XR Shoulder Right   No orders of the defined types were placed in this encounter.   Imaging: No results found.  PMFS History: Patient Active Problem List   Diagnosis Date Noted  . Gait abnormality 06/23/2017  . Memory loss 06/19/2015  . Dizziness 06/10/2015  . Vitamin D deficiency 11/07/2012  . Depression   . Anxiety   . Migraines   . Parkinson's disease ([place])   . Hypothyroidism   . Acid reflux disease   .  Depressive disorder, not elsewhere classified 10/08/2012  . Osteoporosis, unspecified 10/08/2012  . Migraine, unspecified, without mention of intractable migraine without mention of status migrainosus 10/08/2012  . Unspecified hypothyroidism 10/08/2012  . Parkinson's disease 10/08/2012   Past Medical History:  Diagnosis Date  . Acid reflux disease   . Adenomatous colon polyp 09/2000  . Anemia   . Anxiety   . Chronic obstructive airway disease ([place])   . Depression   . Hiatal hernia   . Hyperlipidemia   . Hypothyroidism   . IBS (irritable bowel syndrome)   . Insomnia   . Memory loss   . Migraines   . Osteoporosis   . Parkinson's disease ([place])   . Unspecified vitamin D deficiency 11/07/2012    Family History  Problem Relation Age of Onset  . Alzheimer's disease Mother   . Stroke Mother   . Cancer Father   . Colon cancer Neg Hx     Past Surgical History:  Procedure Laterality Date  . ABDOMINAL HYSTERECTOMY    . bladder  09/2013   bladder tac  . left arm surgery  2004   Social History   Occupational History  . Occupation: Retired  Tobacco Use  . Smoking status: Never Smoker  . Smokeless tobacco: Never Used  Substance and Sexual Activity  . Alcohol use: No    Alcohol/week: 0.0 standard  drinks  . Drug use: No  . Sexual activity: Not on file

## 2019-06-05 ENCOUNTER — Ambulatory Visit: Payer: Medicare HMO | Admitting: Orthopedic Surgery

## 2019-07-10 DIAGNOSIS — E039 Hypothyroidism, unspecified: Secondary | ICD-10-CM | POA: Diagnosis not present

## 2019-07-10 DIAGNOSIS — D539 Nutritional anemia, unspecified: Secondary | ICD-10-CM | POA: Diagnosis not present

## 2019-07-10 DIAGNOSIS — K219 Gastro-esophageal reflux disease without esophagitis: Secondary | ICD-10-CM | POA: Diagnosis not present

## 2019-07-10 DIAGNOSIS — M810 Age-related osteoporosis without current pathological fracture: Secondary | ICD-10-CM | POA: Diagnosis not present

## 2019-07-10 DIAGNOSIS — K589 Irritable bowel syndrome without diarrhea: Secondary | ICD-10-CM | POA: Diagnosis not present

## 2019-07-10 DIAGNOSIS — F329 Major depressive disorder, single episode, unspecified: Secondary | ICD-10-CM | POA: Diagnosis not present

## 2019-07-10 DIAGNOSIS — E785 Hyperlipidemia, unspecified: Secondary | ICD-10-CM | POA: Diagnosis not present

## 2019-07-10 DIAGNOSIS — G20 Parkinson's disease: Secondary | ICD-10-CM | POA: Diagnosis not present

## 2019-07-10 DIAGNOSIS — E559 Vitamin D deficiency, unspecified: Secondary | ICD-10-CM | POA: Diagnosis not present

## 2019-07-12 DIAGNOSIS — N959 Unspecified menopausal and perimenopausal disorder: Secondary | ICD-10-CM | POA: Diagnosis not present

## 2019-07-12 DIAGNOSIS — Z139 Encounter for screening, unspecified: Secondary | ICD-10-CM | POA: Diagnosis not present

## 2019-07-12 DIAGNOSIS — Z1331 Encounter for screening for depression: Secondary | ICD-10-CM | POA: Diagnosis not present

## 2019-07-12 DIAGNOSIS — Z1231 Encounter for screening mammogram for malignant neoplasm of breast: Secondary | ICD-10-CM | POA: Diagnosis not present

## 2019-07-12 DIAGNOSIS — E785 Hyperlipidemia, unspecified: Secondary | ICD-10-CM | POA: Diagnosis not present

## 2019-07-12 DIAGNOSIS — Z2821 Immunization not carried out because of patient refusal: Secondary | ICD-10-CM | POA: Diagnosis not present

## 2019-07-12 DIAGNOSIS — Z9181 History of falling: Secondary | ICD-10-CM | POA: Diagnosis not present

## 2019-07-12 DIAGNOSIS — Z0000 Encounter for general adult medical examination without abnormal findings: Secondary | ICD-10-CM | POA: Diagnosis not present

## 2019-08-15 DIAGNOSIS — M810 Age-related osteoporosis without current pathological fracture: Secondary | ICD-10-CM | POA: Diagnosis not present

## 2019-09-13 DIAGNOSIS — N959 Unspecified menopausal and perimenopausal disorder: Secondary | ICD-10-CM | POA: Diagnosis not present

## 2019-09-13 DIAGNOSIS — M85852 Other specified disorders of bone density and structure, left thigh: Secondary | ICD-10-CM | POA: Diagnosis not present

## 2019-09-13 DIAGNOSIS — M810 Age-related osteoporosis without current pathological fracture: Secondary | ICD-10-CM | POA: Diagnosis not present

## 2019-11-16 DIAGNOSIS — E039 Hypothyroidism, unspecified: Secondary | ICD-10-CM | POA: Diagnosis not present

## 2019-11-16 DIAGNOSIS — M810 Age-related osteoporosis without current pathological fracture: Secondary | ICD-10-CM | POA: Diagnosis not present

## 2019-11-16 DIAGNOSIS — K589 Irritable bowel syndrome without diarrhea: Secondary | ICD-10-CM | POA: Diagnosis not present

## 2019-11-16 DIAGNOSIS — G20 Parkinson's disease: Secondary | ICD-10-CM | POA: Diagnosis not present

## 2019-11-16 DIAGNOSIS — F411 Generalized anxiety disorder: Secondary | ICD-10-CM | POA: Diagnosis not present

## 2020-01-02 DIAGNOSIS — E039 Hypothyroidism, unspecified: Secondary | ICD-10-CM | POA: Diagnosis not present

## 2020-01-02 DIAGNOSIS — M810 Age-related osteoporosis without current pathological fracture: Secondary | ICD-10-CM | POA: Diagnosis not present

## 2020-01-02 DIAGNOSIS — F329 Major depressive disorder, single episode, unspecified: Secondary | ICD-10-CM | POA: Diagnosis not present

## 2020-01-02 DIAGNOSIS — K589 Irritable bowel syndrome without diarrhea: Secondary | ICD-10-CM | POA: Diagnosis not present

## 2020-01-02 DIAGNOSIS — E785 Hyperlipidemia, unspecified: Secondary | ICD-10-CM | POA: Diagnosis not present

## 2020-01-02 DIAGNOSIS — D539 Nutritional anemia, unspecified: Secondary | ICD-10-CM | POA: Diagnosis not present

## 2020-01-02 DIAGNOSIS — K219 Gastro-esophageal reflux disease without esophagitis: Secondary | ICD-10-CM | POA: Diagnosis not present

## 2020-01-02 DIAGNOSIS — G20 Parkinson's disease: Secondary | ICD-10-CM | POA: Diagnosis not present

## 2020-01-02 DIAGNOSIS — E559 Vitamin D deficiency, unspecified: Secondary | ICD-10-CM | POA: Diagnosis not present

## 2020-07-17 DEATH — deceased
